# Patient Record
Sex: Female | Born: 1965
Health system: Southern US, Community
[De-identification: ages and names within clinical notes are randomized; demographics above are authoritative.]

## PROBLEM LIST (undated history)

## (undated) DIAGNOSIS — K589 Irritable bowel syndrome without diarrhea: Secondary | ICD-10-CM

## (undated) DIAGNOSIS — O24419 Gestational diabetes mellitus in pregnancy, unspecified control: Secondary | ICD-10-CM

## (undated) DIAGNOSIS — N920 Excessive and frequent menstruation with regular cycle: Secondary | ICD-10-CM

## (undated) DIAGNOSIS — E663 Overweight: Secondary | ICD-10-CM

## (undated) DIAGNOSIS — K635 Polyp of colon: Secondary | ICD-10-CM

## (undated) HISTORY — PX: CHOLECYSTECTOMY: SHX55

## (undated) HISTORY — DX: Overweight: E66.3

## (undated) HISTORY — DX: Polyp of colon: K63.5

## (undated) HISTORY — PX: ENDOMETRIAL ABLATION: SHX621

## (undated) HISTORY — DX: Irritable bowel syndrome, unspecified: K58.9

## (undated) HISTORY — PX: FINGER SURGERY: SHX640

## (undated) HISTORY — DX: Gestational diabetes mellitus in pregnancy, unspecified control: O24.419

## (undated) HISTORY — DX: Excessive and frequent menstruation with regular cycle: N92.0

## (undated) HISTORY — PX: TUBAL LIGATION: SHX77

---

## 1999-04-04 ENCOUNTER — Other Ambulatory Visit: Admission: RE | Admit: 1999-04-04 | Discharge: 1999-04-04 | Payer: Self-pay | Admitting: Gynecology

## 2001-03-07 ENCOUNTER — Encounter: Payer: Self-pay | Admitting: Emergency Medicine

## 2001-03-07 ENCOUNTER — Emergency Department (HOSPITAL_COMMUNITY): Admission: EM | Admit: 2001-03-07 | Discharge: 2001-03-07 | Payer: Self-pay | Admitting: Emergency Medicine

## 2001-03-29 ENCOUNTER — Emergency Department (HOSPITAL_COMMUNITY): Admission: EM | Admit: 2001-03-29 | Discharge: 2001-03-30 | Payer: Self-pay | Admitting: Emergency Medicine

## 2002-10-29 ENCOUNTER — Other Ambulatory Visit: Admission: RE | Admit: 2002-10-29 | Discharge: 2002-10-29 | Payer: Self-pay | Admitting: Gynecology

## 2004-04-18 ENCOUNTER — Other Ambulatory Visit: Admission: RE | Admit: 2004-04-18 | Discharge: 2004-04-18 | Payer: Self-pay | Admitting: Gynecology

## 2005-04-05 ENCOUNTER — Ambulatory Visit: Payer: Self-pay | Admitting: Cardiology

## 2005-05-03 ENCOUNTER — Other Ambulatory Visit: Admission: RE | Admit: 2005-05-03 | Discharge: 2005-05-03 | Payer: Self-pay | Admitting: Gynecology

## 2007-01-17 ENCOUNTER — Other Ambulatory Visit: Admission: RE | Admit: 2007-01-17 | Discharge: 2007-01-17 | Payer: Self-pay | Admitting: Gynecology

## 2007-03-26 ENCOUNTER — Encounter: Admission: RE | Admit: 2007-03-26 | Discharge: 2007-03-26 | Payer: Self-pay | Admitting: Gynecology

## 2008-01-22 ENCOUNTER — Ambulatory Visit: Payer: Self-pay | Admitting: Gynecology

## 2008-01-22 ENCOUNTER — Encounter: Payer: Self-pay | Admitting: Gynecology

## 2008-01-22 ENCOUNTER — Other Ambulatory Visit: Admission: RE | Admit: 2008-01-22 | Discharge: 2008-01-22 | Payer: Self-pay | Admitting: Gynecology

## 2008-11-26 ENCOUNTER — Ambulatory Visit: Payer: Self-pay | Admitting: Gynecology

## 2008-12-06 ENCOUNTER — Ambulatory Visit: Payer: Self-pay | Admitting: Gynecology

## 2009-03-17 ENCOUNTER — Ambulatory Visit: Payer: Self-pay | Admitting: Gynecology

## 2009-03-17 ENCOUNTER — Other Ambulatory Visit: Admission: RE | Admit: 2009-03-17 | Discharge: 2009-03-17 | Payer: Self-pay | Admitting: Gynecology

## 2010-02-27 ENCOUNTER — Ambulatory Visit: Admit: 2010-02-27 | Payer: Self-pay | Admitting: Gynecology

## 2010-03-20 ENCOUNTER — Other Ambulatory Visit: Payer: Self-pay | Admitting: Gynecology

## 2010-03-20 ENCOUNTER — Encounter: Payer: BC Managed Care – PPO | Admitting: Gynecology

## 2010-03-20 DIAGNOSIS — Z01419 Encounter for gynecological examination (general) (routine) without abnormal findings: Secondary | ICD-10-CM

## 2010-03-20 DIAGNOSIS — Z124 Encounter for screening for malignant neoplasm of cervix: Secondary | ICD-10-CM | POA: Insufficient documentation

## 2010-03-20 DIAGNOSIS — Z13220 Encounter for screening for lipoid disorders: Secondary | ICD-10-CM

## 2010-03-20 DIAGNOSIS — R635 Abnormal weight gain: Secondary | ICD-10-CM

## 2010-04-01 ENCOUNTER — Other Ambulatory Visit (HOSPITAL_COMMUNITY)
Admission: RE | Admit: 2010-04-01 | Discharge: 2010-04-01 | Disposition: A | Discharge status: Home / Self Care | Payer: BC Managed Care – PPO | Source: Ambulatory Visit | Attending: Gynecology | Admitting: Gynecology

## 2010-05-25 ENCOUNTER — Other Ambulatory Visit: Payer: BC Managed Care – PPO

## 2010-07-04 ENCOUNTER — Ambulatory Visit (INDEPENDENT_AMBULATORY_CARE_PROVIDER_SITE_OTHER): Payer: BC Managed Care – PPO | Admitting: Gynecology

## 2010-07-04 ENCOUNTER — Other Ambulatory Visit: Payer: Self-pay | Admitting: Gynecology

## 2010-07-04 DIAGNOSIS — N949 Unspecified condition associated with female genital organs and menstrual cycle: Secondary | ICD-10-CM

## 2010-07-04 DIAGNOSIS — R635 Abnormal weight gain: Secondary | ICD-10-CM

## 2010-08-01 ENCOUNTER — Other Ambulatory Visit: Payer: BC Managed Care – PPO | Admitting: Gynecology

## 2010-08-01 ENCOUNTER — Other Ambulatory Visit: Payer: BC Managed Care – PPO

## 2011-02-12 ENCOUNTER — Encounter: Payer: Self-pay | Admitting: Women's Health

## 2011-02-12 ENCOUNTER — Ambulatory Visit (INDEPENDENT_AMBULATORY_CARE_PROVIDER_SITE_OTHER): Payer: BC Managed Care – PPO | Admitting: Women's Health

## 2011-02-12 DIAGNOSIS — N920 Excessive and frequent menstruation with regular cycle: Secondary | ICD-10-CM

## 2011-02-12 MED ORDER — MEGESTROL ACETATE 20 MG PO TABS: 20.0000 mg | ORAL_TABLET | Freq: Two times a day (BID) | ORAL | Status: AC

## 2011-02-12 NOTE — Patient Instructions (Signed)
Take megace twice daily until bleeding stops and atleast 10  sonohysterogram with Dr Lily Peer  After bleeding stops  HER option ablation

## 2011-02-12 NOTE — Progress Notes (Signed)
Patient ID: Kristy Salas, female   DOB: 1965/07/01, 45 y.o.   MRN: [id number] Presents with a complaint of a heavy period. States cycles are always heavy and atleast 7 days, but this cycle is extreme. Cycle started yesterday at normal time, used lyseteda without relief. Today started bleeding through both tampon and pad every hour with clots.  BTL with a monthly cycle. Normal CBC,TSH, prolactin and negative endometrial biopsy in [facility],0865.  Exam: Abdomen soft, obese nontender. Speculum exam moderate amount of menses type blood noted, bimanual no CMT or adnexal fullness or tenderness.  Plan: Megace 20 mg by mouth twice a day for atleast 10 days or until bleeding stops. Schedule sonohysterogram with Dr. Lily Peer. Her option information was given reviewed.

## 2011-03-01 ENCOUNTER — Other Ambulatory Visit: Payer: BC Managed Care – PPO

## 2011-03-01 ENCOUNTER — Ambulatory Visit: Payer: BC Managed Care – PPO | Admitting: Gynecology

## 2011-03-05 ENCOUNTER — Other Ambulatory Visit: Payer: Self-pay | Admitting: Gynecology

## 2011-03-05 DIAGNOSIS — N920 Excessive and frequent menstruation with regular cycle: Secondary | ICD-10-CM

## 2011-03-06 ENCOUNTER — Ambulatory Visit (INDEPENDENT_AMBULATORY_CARE_PROVIDER_SITE_OTHER): Payer: BC Managed Care – PPO

## 2011-03-06 ENCOUNTER — Ambulatory Visit (INDEPENDENT_AMBULATORY_CARE_PROVIDER_SITE_OTHER): Payer: BC Managed Care – PPO | Admitting: Gynecology

## 2011-03-06 DIAGNOSIS — N920 Excessive and frequent menstruation with regular cycle: Secondary | ICD-10-CM | POA: Insufficient documentation

## 2011-03-06 DIAGNOSIS — D251 Intramural leiomyoma of uterus: Secondary | ICD-10-CM

## 2011-03-06 DIAGNOSIS — D259 Leiomyoma of uterus, unspecified: Secondary | ICD-10-CM

## 2011-03-06 DIAGNOSIS — N83209 Unspecified ovarian cyst, unspecified side: Secondary | ICD-10-CM

## 2011-03-06 DIAGNOSIS — N8300 Follicular cyst of ovary, unspecified side: Secondary | ICD-10-CM

## 2011-03-06 NOTE — Progress Notes (Signed)
Patient 46 year old gravida 3 para 3 with prior history tubal ligation presented to the office today for further evaluation of her ongoing menorrhagia she states her periods last 10-14 days. She had been placed on Lysteda and it brought down her cycles the only to 7 days although still heavy. She had a normal CBC TSH and prolactin and negative endometrial biopsy in May 2012 and she presented today for further evaluation.  Ultrasound today demonstrated uterus to measure 11.2 x 7.5 x 5.5 cm she is currently on her third week of her cycle her endometrium is 14.0. The uterus demonstrated a small intramural myomas measuring 17 x 16 mm, and 12 x 20 mm. Right ovary was normal left ovary had 2 thinwall echo-free follicles 20 x 25 mm, and 15 x 20 mm. Negative color flow. Sonohysterogram was negative.  Patient underwent an endometrial biopsy after cervix had been cleansed with Betadine solution and the uterus sounded to approximately 7 cm and a Pipelle was introduced into the intrauterine cavity to obtain tissue for histological evaluation.  We discussed with the patient the following options: #1 endometrial ablation her option technique in the office we'll check her insurance coverage. #2 Mirena IUD #3 last option would be for a hysterectomy  Patient was not bleeding today. Literature information all the above was provided. She will stop by the lab for her CBC today. Will check insurance company and plan a course of management and her desires by the end of the week.

## 2011-03-07 ENCOUNTER — Other Ambulatory Visit: Payer: Self-pay

## 2011-03-07 ENCOUNTER — Telehealth: Payer: Self-pay

## 2011-03-07 DIAGNOSIS — D72829 Elevated white blood cell count, unspecified: Secondary | ICD-10-CM

## 2011-03-07 DIAGNOSIS — N920 Excessive and frequent menstruation with regular cycle: Secondary | ICD-10-CM

## 2011-03-07 LAB — CBC WITH DIFFERENTIAL/PLATELET
Basophils Absolute: 0 10*3/uL (ref 0.0–0.1)
Basophils Relative: 0 % (ref 0–1)
Eosinophils Absolute: 0.2 10*3/uL (ref 0.0–0.7)
Eosinophils Relative: 2 % (ref 0–5)
HCT: 40 % (ref 36.0–46.0)
Lymphocytes Relative: 23 % (ref 12–46)
MCH: 31.6 pg (ref 26.0–34.0)
MCHC: 33 g/dL (ref 30.0–36.0)
MCV: 95.7 fL (ref 78.0–100.0)
Monocytes Absolute: 0.6 10*3/uL (ref 0.1–1.0)
Platelets: 282 10*3/uL (ref 150–400)
RDW: 13 % (ref 11.5–15.5)

## 2011-03-07 NOTE — Telephone Encounter (Signed)
I spoke with patient and let her know that both IUD and ablation in office are billable codes and she will have a $30 copayment and ins will pay 100% after that.  She wants to have Her Option Ablation done.  Dr. Glenetta Hew rec mid to third week of Feb as he anticipates her menses end of Jan. Patient said she is not sure when period will start. She will call me when it does and I will call Prometrium in for her to start Day 3 of menses.  She has a preexisting waiver attached to her ins plan and it ends 03/30/11.  I recommended to her that she wait to schedule this surgery until after that date as it is very expensive and could cause the ins co to look back to six months before her hire date to see if she was ever seen or treated for menorrhagia since then until 03/30/11.  If so, they could deny payment for procedure.  Patient is going to be out of town anyway from Feb 17-21.  The first available would be Feb 27th and I am concerned that is too close to when her period may start again. Patient wants me to schedule it here and she will reschedule if her period interferes.  I went ahead and scheduled it and instructions given regarding someone to drive her to and from procedure and needing a full bladder morning of procedure.  Instructions for full bladder along with her dates were mailed to her.  She will call me Day One of Menses and I will call Prometrium in.  Dr. Glenetta Hew- I hope this is acceptable to you. Thanks. KA

## 2011-03-08 NOTE — Telephone Encounter (Signed)
I spoke with Dr. Glenetta Hew regarding this and he did think all of this was fine.

## 2011-03-13 ENCOUNTER — Other Ambulatory Visit: Payer: BC Managed Care – PPO

## 2011-03-13 ENCOUNTER — Telehealth: Payer: Self-pay | Admitting: *Deleted

## 2011-03-13 NOTE — Telephone Encounter (Signed)
Pt was to come back in for cbc recheck due to abnormal WBC. Pt is feeling sick now and will reschedule appointment for lab recheck.

## 2011-03-21 ENCOUNTER — Telehealth: Payer: Self-pay

## 2011-03-21 MED ORDER — PROGESTERONE MICRONIZED 200 MG PO CAPS: ORAL_CAPSULE | ORAL | Status: DC

## 2011-03-21 NOTE — Telephone Encounter (Signed)
Patient called. Her menses began yesterday.  She will start Prometrium tomorrow.  Pt instructed to take hs beginning tomorrow until procedure.

## 2011-03-22 ENCOUNTER — Encounter: Payer: BC Managed Care – PPO | Admitting: Gynecology

## 2011-04-10 ENCOUNTER — Ambulatory Visit (INDEPENDENT_AMBULATORY_CARE_PROVIDER_SITE_OTHER): Payer: BC Managed Care – PPO | Admitting: Gynecology

## 2011-04-10 ENCOUNTER — Encounter: Payer: Self-pay | Admitting: Gynecology

## 2011-04-10 VITALS — BP 128/86

## 2011-04-10 DIAGNOSIS — N920 Excessive and frequent menstruation with regular cycle: Secondary | ICD-10-CM

## 2011-04-10 DIAGNOSIS — Z01818 Encounter for other preprocedural examination: Secondary | ICD-10-CM

## 2011-04-10 MED ORDER — DIAZEPAM 5 MG PO TABS: 5.0000 mg | ORAL_TABLET | Freq: Four times a day (QID) | ORAL | Status: AC | PRN

## 2011-04-10 MED ORDER — MISOPROSTOL 200 MCG PO TABS: 200.0000 ug | ORAL_TABLET | Freq: Once | ORAL | Status: DC

## 2011-04-10 MED ORDER — AZITHROMYCIN 500 MG PO TABS: 500.0000 mg | ORAL_TABLET | Freq: Every day | ORAL | Status: AC

## 2011-04-10 MED ORDER — HYDROCODONE-ACETAMINOPHEN 5-500 MG PO TABS: 2.0000 | ORAL_TABLET | Freq: Four times a day (QID) | ORAL | Status: AC | PRN

## 2011-04-10 NOTE — Patient Instructions (Signed)
Please follow printed instructions

## 2011-04-10 NOTE — Progress Notes (Signed)
Patient presented to the office today for preoperative examination prior to office endometrial ablation scheduled for tomorrow as a result of patient's menorrhagia. Patient with prior history of tubal ligation. Her preoperative evaluation has included a sonohysterogram recently with no intracavitary defects noted. Her endometrial biopsy demonstrated the following:  POLYPOID LATE PROLIFERATIVE/EARLY SECRETORY PHASE ENDOMETRIUM WITH BREAKDOWN. - NEGATIVE FOR HYPERPLASIA OR MALIGNANCY. - FRAGMENTS OF BENIGN ENDOCERVICAL MUCOSA  Patient has been kept on Prometrium 200 mg each bedtime for the past 2 weeks. Literature information had previously been provided on the office endometrial ablation (HerOption)  Exam: Abdomen soft nontender no rebound or guarding Pelvic: Bartholin urethra Skene was within normal limits Vagina: No gross lesions on inspection Cervix: No lesions or discharge Uterus: 10 weeks size Adnexa: No palpable masses or tenderness Rectal: Not examined  We discussed the procedure in detail. Her cervix was cleansed with Betadine solution a single-tooth tenaculum was then placed on the anterior cervical lip. The uterus sounded to 7-8 cm. A laminaria medium-size was placed intracervically. The single-tooth tenaculum was removed a gauze was left in the vagina to keep the laminaria in place.  Patient was given the following prescriptions: Zithromax 500 mg for her to take tomorrow morning before procedure and to repeat in 24 hours. Cytotec 200 mcg to take 1 tablet by mouth tonight. Valium 5 mg to take 1 by mouth 3 times a day when necessary anxiety. Vicodin 1 tablet by mouth every 4-6 hours when necessary cramping tonight or tomorrow to procedure. Consent form was signed instructions provided all questions were answered we'll follow accordingly.

## 2011-04-11 ENCOUNTER — Ambulatory Visit (INDEPENDENT_AMBULATORY_CARE_PROVIDER_SITE_OTHER): Payer: BC Managed Care – PPO

## 2011-04-11 ENCOUNTER — Encounter: Payer: Self-pay | Admitting: Gynecology

## 2011-04-11 ENCOUNTER — Ambulatory Visit (INDEPENDENT_AMBULATORY_CARE_PROVIDER_SITE_OTHER): Payer: BC Managed Care – PPO | Admitting: Gynecology

## 2011-04-11 DIAGNOSIS — N920 Excessive and frequent menstruation with regular cycle: Secondary | ICD-10-CM

## 2011-04-11 DIAGNOSIS — N882 Stricture and stenosis of cervix uteri: Secondary | ICD-10-CM

## 2011-04-11 DIAGNOSIS — N949 Unspecified condition associated with female genital organs and menstrual cycle: Secondary | ICD-10-CM

## 2011-04-11 MED ORDER — KETOROLAC TROMETHAMINE 30 MG/ML IJ SOLN
60.0000 mg | Freq: Once | INTRAMUSCULAR | Status: AC
  Administered 2011-04-11: 60 mg via INTRAMUSCULAR

## 2011-04-11 MED ORDER — LIDOCAINE HCL 1 % IJ SOLN: 10.0000 mL | Freq: Once | INTRAMUSCULAR | Status: DC

## 2011-04-11 NOTE — Progress Notes (Signed)
Patient Name:Kristy Salas  Patient MRN: [id number]   Date:04/11/2011   Diagnosis:  Excessive Uterine Bleeding/Menorrhagia  Procedure:  Endometrial cryoablation with intraoperative ultrasonic guidance  Procedure Medications Toradol 60 mg IM. Paracervical block with 1% lidocaine total 10 cc  Procedure:  The Patient was brought to the treatment room having previously been counseled for the procedure position and a speculum was inserted.  The cervix and upper vagina were cleaned with Betadine.  A single tooth tenaculum was placed on the anterior lip of the cervix.  A paracervical block was placed per above.  The uterus was not sounded to 7 cm.  Cervical dilation was performed.  Under ultrasound guidance, the Her Option probe was introduced into the uterine cavity after the pre procedural sequence was performed.  After assuring proper cornual placement, Cryoablation was then performed under continuous ultrasound guidance monitoring the growth of the cryozone.  Sequential cryoablation were performed in the following order, locations, freeze times and post freeze myometrial depths.           Location of Freeze Length of Time Myometrial Depth 1. right    6 Minutes  7.4 mm 2. left    6 Minutes  7.4 mm      Upon completion of the procedure, the instruments were removed, hemostasis visualized and the patient was assisted to the bathroom and then another exam room where she was observed.  Vitals:   Pre treatment:  Time: 10 AM  BP: 110/78  P: 68 Post Treatment:  Time: 12:22 PM  BP: 03/07/1978  P: 60  The patient tolerated the procedure well and was released in stable condition with her driver along with a copy of the post procedure instruments which were reviewed with her.  She is to return to the office in 2 weeks for a post procedural check.  [facility] HMD4:26 PMTD@

## 2011-04-12 ENCOUNTER — Telehealth: Payer: Self-pay | Admitting: *Deleted

## 2011-04-12 NOTE — Telephone Encounter (Signed)
Patient called with questions about post her option.  Bleed, nausea, answered questions.

## 2011-04-25 ENCOUNTER — Ambulatory Visit (INDEPENDENT_AMBULATORY_CARE_PROVIDER_SITE_OTHER): Payer: BC Managed Care – PPO | Admitting: Gynecology

## 2011-04-25 ENCOUNTER — Encounter: Payer: Self-pay | Admitting: Gynecology

## 2011-04-25 VITALS — BP 128/86

## 2011-04-25 DIAGNOSIS — Z9889 Other specified postprocedural states: Secondary | ICD-10-CM

## 2011-04-25 DIAGNOSIS — K602 Anal fissure, unspecified: Secondary | ICD-10-CM

## 2011-04-25 DIAGNOSIS — D72829 Elevated white blood cell count, unspecified: Secondary | ICD-10-CM

## 2011-04-25 MED ORDER — METRONIDAZOLE 500 MG PO TABS: ORAL_TABLET | ORAL | Status: DC

## 2011-04-25 MED ORDER — HYDROCORTISONE VALERATE 0.2 % EX CREA: TOPICAL_CREAM | Freq: Two times a day (BID) | CUTANEOUS | Status: AC

## 2011-04-25 NOTE — Patient Instructions (Signed)
Anal Fissure, Adult An anal fissure is a small tear or crack in the skin around the anus. Bleeding from a fissure usually stops on its own within a few minutes. However, bleeding will often reoccur with each bowel movement until the crack heals.  CAUSES   Passing large, hard stools.   Frequent diarrheal stools.   Constipation.   Inflammatory bowel disease (Crohn's disease or ulcerative colitis).   Infections.   Anal sex.  SYMPTOMS   Small amounts of blood seen on your stools, on toilet paper, or in the toilet after a bowel movement.   Rectal bleeding.   Painful bowel movements.   Itching or irritation around the anus.  DIAGNOSIS Your caregiver will examine the anal area. An anal fissure can usually be seen with careful inspection. A rectal exam may be performed and a short tube (anoscope) may be used to examine the anal canal. TREATMENT   You may be instructed to take fiber supplements. These supplements can soften your stool to help make bowel movements easier.   Sitz baths may be recommended to help heal the tear. Do not use soap in the sitz baths.   A medicated cream or ointment may be prescribed to lessen discomfort.  HOME CARE INSTRUCTIONS   Maintain a diet high in fruits, whole grains, and vegetables. Avoid constipating foods like bananas and dairy products.   Take sitz baths as directed by your caregiver.   Drink enough fluids to keep your urine clear or pale yellow.   Only take over-the-counter or prescription medicines for pain, discomfort, or fever as directed by your caregiver. Do not take aspirin as this may increase bleeding.   Do not use ointments containing numbing medications (anesthetics) or hydrocortisone. They could slow healing.  SEEK MEDICAL CARE IF:   Your fissure is not completely healed within 3 days.   You have further bleeding.   You have a fever.   You have diarrhea mixed with blood.   You have pain.   Your problem is getting worse  rather than better.  MAKE SURE YOU:   Understand these instructions.   Will watch your condition.   Will get help right away if you are not doing well or get worse.  Document Released: 01/29/2005 Document Revised: 01/18/2011 Document Reviewed: 07/16/2010 Exit[employer] Patient Information 2012 [employer]. 

## 2011-04-25 NOTE — Progress Notes (Signed)
Patient presented to the office today for her postop visit she is status post her option endometrial ablation for menorrhagia 2 weeks ago. She is complaining of slight watery discharge. She's also complaining of an area of irritation near her rectum. Patient had a CBC on January 22 that demonstrated her white blood count was 12,000. On further questioning it appears she was getting over a URI near the time that it was drawn. Nevertheless will check a CBC today.  Exam: Abdomen soft nontender no rebound or guarding Pelvic: Bartholin urethra Skene glands within normal limits Vagina: Slight watery discharge Cervix: No lesion discharge Uterus: Anteverted normal size shape and consistency Rectal: Not examined  Approximately 3-4 finger breast posterior to her rectum there appears to be small fissure but no drainage. She will be placed on sitz baths daily for the next 7-10 days. She was also p prescribed Westcort cream 0.2% to apply 2-3 times a day for the next 7-10 days. For watery discharge she was given Flagyl 500 mg to take 1 by mouth twice a day for 5 days. She's otherwise scheduled to return back in one year or when necessary.

## 2011-04-26 LAB — CBC WITH DIFFERENTIAL/PLATELET
Basophils Relative: 1 % (ref 0–1)
HCT: 39.9 % (ref 36.0–46.0)
Hemoglobin: 13.5 g/dL (ref 12.0–15.0)
Lymphs Abs: 2.9 10*3/uL (ref 0.7–4.0)
MCH: 31.6 pg (ref 26.0–34.0)
MCHC: 33.8 g/dL (ref 30.0–36.0)
Monocytes Absolute: 0.7 10*3/uL (ref 0.1–1.0)
Monocytes Relative: 9 % (ref 3–12)
Neutro Abs: 4.2 10*3/uL (ref 1.7–7.7)
RBC: 4.27 MIL/uL (ref 3.87–5.11)

## 2011-06-15 ENCOUNTER — Encounter: Payer: Self-pay | Admitting: Gynecology

## 2012-05-01 ENCOUNTER — Ambulatory Visit (INDEPENDENT_AMBULATORY_CARE_PROVIDER_SITE_OTHER): Payer: Self-pay | Admitting: Gynecology

## 2012-05-01 ENCOUNTER — Encounter: Payer: Self-pay | Admitting: Gynecology

## 2012-05-01 VITALS — BP 128/74 | Ht 62.75 in | Wt 228.0 lb

## 2012-05-01 DIAGNOSIS — R635 Abnormal weight gain: Secondary | ICD-10-CM | POA: Insufficient documentation

## 2012-05-01 DIAGNOSIS — R413 Other amnesia: Secondary | ICD-10-CM | POA: Insufficient documentation

## 2012-05-01 DIAGNOSIS — Z8632 Personal history of gestational diabetes: Secondary | ICD-10-CM

## 2012-05-01 LAB — CBC WITH DIFFERENTIAL/PLATELET
Basophils Relative: 1 % (ref 0–1)
Eosinophils Relative: 2 % (ref 0–5)
HCT: 40 % (ref 36.0–46.0)
Hemoglobin: 13.7 g/dL (ref 12.0–15.0)
MCH: 31.6 pg (ref 26.0–34.0)
MCHC: 34.3 g/dL (ref 30.0–36.0)
MCV: 92.2 fL (ref 78.0–100.0)
Monocytes Absolute: 0.5 10*3/uL (ref 0.1–1.0)
Monocytes Relative: 7 % (ref 3–12)
Neutro Abs: 4 10*3/uL (ref 1.7–7.7)
RDW: 13.3 % (ref 11.5–15.5)

## 2012-05-01 MED ORDER — TRIAMCINOLONE 0.1 % CREAM:EUCERIN CREAM 1:1: 1.0000 "application " | TOPICAL_CREAM | Freq: Three times a day (TID) | CUTANEOUS | Status: DC

## 2012-05-01 NOTE — Patient Instructions (Addendum)

## 2012-05-01 NOTE — Progress Notes (Signed)
Kristy Salas 13-Feb-1965 [id number]   History:    47 y.o.  for annual gyn exam who is concerned that she was having minimal menses since she had her endometrial ablation that was done in 2013. She did bring up to my attention that over the past several years she has noted that she has difficulty staying  on task whereby she is doing many things at the same time before completing one project and moving to the next. She also feels that her short-term memory is decreasing as well as trying to stay focused. She stated that she never went beyond high school and had difficulty in learning. 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  Patient would know prior history of abnormal Pap smear. Patient has not received a Tdap vaccine. Patient with prior tubal sterilization procedure.  Patient's brother had history of colon cancer as well as her on call. Patient had a colonoscopy 2010 by Dr. Elnoria Howard who removed benign polyps. Patient's mother had cirrhosis of the liver (hepatitis C).  Patient has  a sister who had breast cancer at the age of 66 and was to check with her sister to see if she had the BRCA one BRCA2 testing.  Patient with past history of gestational diabetes   Past medical history,surgical history, family history and social history were all reviewed and documented in the EPIC chart.  Gynecologic History Patient's last menstrual period was 04/10/2012. Contraception: tubal ligation Last Pap: 2012. Results were: normal Last mammogram: April 2013. Results were: normal  Obstetric History OB History   Grav Para Term Preterm Abortions TAB SAB Ect Mult Living   3 3        3      # Outc Date GA Lbr Len/2nd Wgt Sex Del Anes PTL Lv   1 PAR            2 PAR            3 PAR                ROS: A ROS was performed and pertinent positives and negatives are included in the history.  GENERAL: No fevers or chills. HEENT: No change in vision, no earache, sore throat or sinus  congestion. NECK: No pain or stiffness. CARDIOVASCULAR: No chest pain or pressure. No palpitations. PULMONARY: No shortness of breath, cough or wheeze. GASTROINTESTINAL: No abdominal pain, nausea, vomiting or diarrhea, melena or bright red blood per rectum. GENITOURINARY: No urinary frequency, urgency, hesitancy or dysuria. MUSCULOSKELETAL: No joint or muscle pain, no back pain, no recent trauma. DERMATOLOGIC: No rash, no itching, no lesions. ENDOCRINE: No polyuria, polydipsia, no heat or cold intolerance. No recent change in weight. HEMATOLOGICAL: No anemia or easy bruising or bleeding. NEUROLOGIC: No headache, seizures, numbness, tingling or weakness. PSYCHIATRIC: No depression, no loss of interest in normal activity or change in sleep pattern.     Exam: chaperone present  BP 128/74  Ht 5' 2.75" (1.594 m)  Wt 228 lb (103.42 kg)  BMI 40.7 kg/m2  LMP 04/10/2012  Body mass index is 40.7 kg/(m^2).  General appearance : Well developed well nourished female. No acute distress HEENT: Neck supple, trachea midline, no carotid bruits, no thyroidmegaly Lungs: Clear to auscultation, no rhonchi or wheezes, or rib retractions  Heart: Regular rate and rhythm, no murmurs or gallops Breast:Examined in sitting and supine position were symmetrical in appearance, no palpable masses or tenderness,  no skin retraction, no nipple  inversion, no nipple discharge, no skin discoloration, no axillary or supraclavicular lymphadenopathy Abdomen: no palpable masses or tenderness, no rebound or guarding Extremities: no edema or skin discoloration or tenderness  Pelvic:  Bartholin, Urethra, Skene Glands: Within normal limits             Vagina: No gross lesions or discharge  Cervix: No gross lesions or discharge  Uterus  anteverted, normal size, shape and consistency, non-tender and mobile  Adnexa  Without masses or tenderness  Anus and perineum  normal   Rectovaginal  normal sphincter tone without palpated masses or  tenderness             Hemoccult: Not done.     Assessment/Plan:  47 y.o. female for annual exam who was reassured that but not having any menses are very light after an endometrial ablation is reassuring. She is going to be referred to the neurologist at [facility] for further evaluation of her memory loss and the possibility she may have ADD. No Pap smear done today the new guidelines were discussed. She was reminded to do her monthly self breast examination and her mammogram is due in April. She will be mailed the Hemoccult cards for her to submit to the office for testing and next year she will need a colonoscopy cystoscopy will be 5 years since her last colonoscopy were benign polyps were detected.the following labs were ordered: CBC, screening cholesterol, hemoglobin A1c, TSH and urinalysis. The Tdap vaccine was provided today.    Ok Edwards MD, 5:41 PM 05/01/2012

## 2012-05-01 NOTE — Progress Notes (Deleted)
Spotting only

## 2012-05-02 ENCOUNTER — Other Ambulatory Visit: Payer: Self-pay | Admitting: *Deleted

## 2012-05-02 ENCOUNTER — Telehealth: Payer: Self-pay | Admitting: *Deleted

## 2012-05-02 DIAGNOSIS — E7800 Pure hypercholesterolemia, unspecified: Secondary | ICD-10-CM

## 2012-05-02 LAB — URINALYSIS W MICROSCOPIC + REFLEX CULTURE
Casts: NONE SEEN
Crystals: NONE SEEN
Glucose, UA: NEGATIVE mg/dL
Ketones, ur: NEGATIVE mg/dL
Leukocytes, UA: NEGATIVE
Nitrite: NEGATIVE
Specific Gravity, Urine: 1.021 (ref 1.005–1.030)
pH: 5.5 (ref 5.0–8.0)

## 2012-05-02 LAB — TSH: TSH: 2.201 u[IU]/mL (ref 0.350–4.500)

## 2012-05-02 LAB — HEMOGLOBIN A1C: Mean Plasma Glucose: 108 mg/dL (ref <117)

## 2012-05-02 MED ORDER — TRIAMCINOLONE ACETONIDE 0.1 % EX CREA: TOPICAL_CREAM | Freq: Two times a day (BID) | CUTANEOUS | Status: DC

## 2012-05-02 NOTE — Telephone Encounter (Signed)
Pharmacy called regarding the rx for TRIAMCINOLONE 0.1 % CREAM : EUCERIN CREAM sent yesterday. She is asking for the ratio and the quantity of medication. She stated there are different ratio's  & quantity depending on why patient is taking medication. I didn't see in office why pt is taking rx. Please advise

## 2012-05-02 NOTE — Telephone Encounter (Signed)
Triamcinolone 0.1% to apply twice a day for 5-7 days #1small tube

## 2012-05-02 NOTE — Telephone Encounter (Signed)
Correct rx sent.

## 2012-05-06 ENCOUNTER — Telehealth: Payer: Self-pay | Admitting: *Deleted

## 2012-05-06 NOTE — Telephone Encounter (Signed)
Message copied by Aura Camps on Tue May 06, 2012  8:58 AM ------      Message from: Ok Edwards      Created: Fri May 02, 2012 12:55 PM       I would recommend then that you make an [facility] for further discussion and possible testing. Tell her to make a list of all the relatives on both sides of the family who have had any form of cancer and to bring it with her to that appointment.      ----- Message -----         From: Ted Mcalpine         Sent: 05/02/2012  12:30 PM           To: Ok Edwards, MD            Pt said she wasn't sure if her 1 sister that had breast cancer had testing done. Pt said if you think this beast for her to have BRCA testing done, then she is willing to do this.      ----- Message -----         From: Ok Edwards, MD         Sent: 05/01/2012   5:53 PM           To: Betsey Amen, please contact this patient and tell her that she left before we gave her the Hemoccult cards for testing. With her family history of colon cancer and her having had polyps in 2010 she needs her colonoscopy next year. Also ask her if her sister ever was tested for the BRCA one BRCA2 gene mutation for breast cancer.             ------

## 2012-05-06 NOTE — Telephone Encounter (Signed)
Referral faxed to [facility] for BRCA1 & 2 testing.

## 2012-05-07 ENCOUNTER — Telehealth: Payer: Self-pay | Admitting: *Deleted

## 2012-05-07 DIAGNOSIS — R413 Other amnesia: Secondary | ICD-10-CM

## 2012-05-07 NOTE — Telephone Encounter (Signed)
We can refer her to one of the neurologist in [facility].

## 2012-05-07 NOTE — Telephone Encounter (Signed)
Pt was given a name to a physician at [facility] to be evaluation of her memory loss, pt called and tried to make appointment, but the doctor is no longer there. Would you like patient to be seen by physician in [facility]? Please advise

## 2012-05-08 ENCOUNTER — Other Ambulatory Visit: Payer: BC Managed Care – PPO

## 2012-05-08 DIAGNOSIS — E7800 Pure hypercholesterolemia, unspecified: Secondary | ICD-10-CM

## 2012-05-08 LAB — LIPID PANEL
Total CHOL/HDL Ratio: 4.5 Ratio
VLDL: 41 mg/dL — ABNORMAL HIGH (ref 0–40)

## 2012-05-08 NOTE — Telephone Encounter (Signed)
Referral placed for Dr. Arbutus Leas. I spoke with office and the physician will review records and schedule they contact us with time and date.

## 2012-05-09 ENCOUNTER — Other Ambulatory Visit: Payer: Self-pay | Admitting: *Deleted

## 2012-05-09 DIAGNOSIS — E7800 Pure hypercholesterolemia, unspecified: Secondary | ICD-10-CM

## 2012-05-12 ENCOUNTER — Telehealth: Payer: Self-pay | Admitting: Neurology

## 2012-05-12 NOTE — Telephone Encounter (Signed)
The patient is hoping to get a status on if she is able to schedule an appointment with Dr.Tat.  Can I go ahead and schedule this?

## 2012-05-12 NOTE — Telephone Encounter (Signed)
Called pt to schedule, left voice mail message on machine.

## 2012-05-12 NOTE — Telephone Encounter (Signed)
Nevermind - I have pt's approved referral and will be calling pt to schedule now.   Thanks!

## 2012-05-15 ENCOUNTER — Telehealth: Payer: Self-pay | Admitting: *Deleted

## 2012-05-15 NOTE — Telephone Encounter (Signed)
Left message for pt to return my call so I can schedule a genetic appt.  

## 2012-05-15 NOTE — Telephone Encounter (Signed)
App[facility].

## 2012-05-15 NOTE — Telephone Encounter (Signed)
Pt returned my call and I confirmed 06/02/12 genetic appt w/ pt.  Called referring to make them aware.

## 2012-06-02 ENCOUNTER — Other Ambulatory Visit: Payer: BC Managed Care – PPO | Admitting: Lab

## 2012-06-02 ENCOUNTER — Encounter: Payer: Self-pay | Admitting: Lab

## 2012-10-10 ENCOUNTER — Encounter: Payer: Self-pay | Admitting: Gynecology

## 2013-07-23 ENCOUNTER — Other Ambulatory Visit: Payer: Self-pay | Admitting: Gynecology

## 2013-07-23 ENCOUNTER — Telehealth: Payer: Self-pay

## 2013-07-23 DIAGNOSIS — N920 Excessive and frequent menstruation with regular cycle: Secondary | ICD-10-CM

## 2013-07-23 MED ORDER — MEGESTROL ACETATE 40 MG PO TABS: 40.0000 mg | ORAL_TABLET | Freq: Two times a day (BID) | ORAL | Status: DC

## 2013-07-23 NOTE — Telephone Encounter (Signed)
Has been having some heavy vaginal bleeding.  Unusual for her since history of endo ablation. She said she just got her period for the second time this month this morning and now it is extremely heavy/  She questions if she needs to do anything or come in  Before she leaves on vacation in the morning.

## 2013-07-23 NOTE — Telephone Encounter (Signed)
We can call in Megace 40mg  she can take BID for 2 weeks. I then would need to see her with for sonohysterogram. This mdicine will stop her bleeding so she can enjoy her vacation since she is leaving tomorrow.

## 2013-07-23 NOTE — Telephone Encounter (Signed)
Left detailed message on her cell phone voice mail.  Asked her to call back to schedule [facility] and order will be in system.  Rx sent to pharmacy and I explained to patient how to take it in voice mail. Told her to call if any questions.

## 2013-08-03 ENCOUNTER — Encounter: Payer: BC Managed Care – PPO | Admitting: Gynecology

## 2013-08-25 ENCOUNTER — Other Ambulatory Visit: Payer: Self-pay | Admitting: Gynecology

## 2013-08-25 DIAGNOSIS — N920 Excessive and frequent menstruation with regular cycle: Secondary | ICD-10-CM

## 2013-08-28 ENCOUNTER — Ambulatory Visit (INDEPENDENT_AMBULATORY_CARE_PROVIDER_SITE_OTHER): Payer: BC Managed Care – PPO | Admitting: Gynecology

## 2013-08-28 ENCOUNTER — Ambulatory Visit (INDEPENDENT_AMBULATORY_CARE_PROVIDER_SITE_OTHER): Payer: BC Managed Care – PPO

## 2013-08-28 DIAGNOSIS — N938 Other specified abnormal uterine and vaginal bleeding: Secondary | ICD-10-CM

## 2013-08-28 DIAGNOSIS — N949 Unspecified condition associated with female genital organs and menstrual cycle: Secondary | ICD-10-CM

## 2013-08-28 DIAGNOSIS — N925 Other specified irregular menstruation: Secondary | ICD-10-CM

## 2013-08-28 DIAGNOSIS — N920 Excessive and frequent menstruation with regular cycle: Secondary | ICD-10-CM

## 2013-08-28 LAB — CBC WITH DIFFERENTIAL/PLATELET
Basophils Absolute: 0.1 10*3/uL (ref 0.0–0.1)
Basophils Relative: 1 % (ref 0–1)
Eosinophils Absolute: 0.1 10*3/uL (ref 0.0–0.7)
Eosinophils Relative: 1 % (ref 0–5)
HCT: 40.2 % (ref 36.0–46.0)
Hemoglobin: 14 g/dL (ref 12.0–15.0)
LYMPHS ABS: 2.3 10*3/uL (ref 0.7–4.0)
Lymphocytes Relative: 36 % (ref 12–46)
MCH: 31.9 pg (ref 26.0–34.0)
MCHC: 34.8 g/dL (ref 30.0–36.0)
MCV: 91.6 fL (ref 78.0–100.0)
MONOS PCT: 7 % (ref 3–12)
Monocytes Absolute: 0.4 10*3/uL (ref 0.1–1.0)
NEUTROS ABS: 3.5 10*3/uL (ref 1.7–7.7)
NEUTROS PCT: 55 % (ref 43–77)
Platelets: 275 10*3/uL (ref 150–400)
RBC: 4.39 MIL/uL (ref 3.87–5.11)
RDW: 13.2 % (ref 11.5–15.5)
WBC: 6.4 10*3/uL (ref 4.0–10.5)

## 2013-08-28 LAB — TSH: TSH: 2.119 u[IU]/mL (ref 0.350–4.500)

## 2013-08-28 NOTE — Progress Notes (Signed)
   Patient presented to the office today for evaluation of her recent dysfunction uterine bleeding. On June 11 before vacation she had called the office stating that she had 2 cycles in one month and was heavy. Patient has history of endometrial ablation approximately 3 years ago and had done well. She was started on Megace 40 mg twice a day for 2 weeks and scheduled to return today which she has for sonohysterogram.  Ultrasound: Uterus measuring 9.5 x 6.7 x 5.0 cm with endometrial stripe of 6.3 mm. Patient with 3 small fibroids the largest one measuring 21 x 15 mm essentially unchanged from previous ultrasounds. Ovaries was normal.  Patient was counseled for for endometrial biopsy. The cervix was cleansed with Betadine solution. A single-tooth tenaculum was placed on the anterior cervical lip a sterile Pipelle was introduced through the intrauterine cavity which broke up some agglutination of the uterine wall which caused some discomfort but the sonohysterogram demonstrated no intracavitary defects. An endometrial biopsy was obtained and submitted for histological evaluation.  Assessment/plan: Isolated event of 2 cycles in one month recently after endometrial ablation several years ago. Patient with history of very small fibroids none of them encroaching into the uterine cavity. Normal sonohysterogram. Endometrial biopsy pathology report pending. We'll obtain TSH and prolactin along with a CBC. These lab tests are normal we'll continue to monitor over the next 3-6 months.

## 2013-08-29 LAB — PROLACTIN: PROLACTIN: 8.5 ng/mL

## 2013-09-01 ENCOUNTER — Encounter: Payer: BC Managed Care – PPO | Admitting: Gynecology

## 2013-09-10 ENCOUNTER — Other Ambulatory Visit (HOSPITAL_COMMUNITY)
Admission: RE | Admit: 2013-09-10 | Discharge: 2013-09-10 | Disposition: A | Discharge status: Home / Self Care | Payer: BC Managed Care – PPO | Source: Ambulatory Visit | Attending: Gynecology | Admitting: Gynecology

## 2013-09-10 ENCOUNTER — Encounter: Payer: Self-pay | Admitting: Gynecology

## 2013-09-10 ENCOUNTER — Ambulatory Visit (INDEPENDENT_AMBULATORY_CARE_PROVIDER_SITE_OTHER): Payer: BC Managed Care – PPO | Admitting: Gynecology

## 2013-09-10 VITALS — BP 126/78 | Ht 63.0 in | Wt 230.0 lb

## 2013-09-10 DIAGNOSIS — Z1151 Encounter for screening for human papillomavirus (HPV): Secondary | ICD-10-CM | POA: Insufficient documentation

## 2013-09-10 DIAGNOSIS — Z01419 Encounter for gynecological examination (general) (routine) without abnormal findings: Secondary | ICD-10-CM

## 2013-09-10 DIAGNOSIS — Z803 Family history of malignant neoplasm of breast: Secondary | ICD-10-CM

## 2013-09-10 DIAGNOSIS — Z800 Family history of malignant neoplasm of digestive organs: Secondary | ICD-10-CM

## 2013-09-10 NOTE — Patient Instructions (Signed)
Our office will call you with appointment to see geneticist.

## 2013-09-11 ENCOUNTER — Telehealth: Payer: Self-pay | Admitting: *Deleted

## 2013-09-11 NOTE — Progress Notes (Signed)
Kristy Salas 1965/02/15 [id number]   History:    48 y.o.  for annual gyn exam who was seen in the office on July 17 for evaluation of recent dysfunction uterine bleeding she had reported. She had reported that in June while on vacation she had 2 cycles a month and was reported to be heavy. Review of her records indicated that she had had endometrial ablation 3 years prior. She had done well until then. She had been started on Megace 40 mg twice a day for 2 weeks and to follow up with a sonohysterogram which was done on July 17. Her ultrasound demonstrated the following:  Uterus measuring 9.5 x 6.7 x 5.0 cm with endometrial stripe of 6.3 mm. Patient had 3 small fibroids the largest one measuring 21 x 15 mm essentially unchanged from previous ultrasounds. Her ovaries were reported to be normal. She had a normal sonohysterogram was no intracavitary defects and an endometrial biopsy had been performed which demonstrated the following:  Endometrium, biopsy - DEGENERATING SECRETORY-TYPE ENDOMETRIUM. - THERE IS NO EVIDENCE OF HYPERPLASIA OR MALIGNANCY. - SEE COMMENT. Microscopic Comment This pattern can be associated with menses, irregular shedding or breakthrough bleeding associated with hormonal therapy.  On the same visit patient had a TSH and prolactin CBC which all were normal. Who reports monitor cycles next 3-6 months of this persisted she would be a candidate for hysterectomy. She was asymptomatic today. She was told me that she has fallen family members with different types of cancer: Her sister had breast cancer in her 25s and has not been tested for BRCA1 and BRCA2 gene mutation. Another sister had kidney cancer and her brother had colon cancer in his 21s. Patient herself had a colonoscopy in 2010 by Dr. Benson Norway who had removed benign colon polyps. Her mother had cirrhosis of the liver (hepatitis C) Patient with no previous history of abnormal Pap smears.  Past medical history,surgical  history, family history and social history were all reviewed and documented in the EPIC chart.  Gynecologic History Patient's last menstrual period was 07/22/2013. Contraception: tubal ligation Last Pap: 2012. Results were: normal Last mammogram: 2014. Results were: normal  Obstetric History OB History  Gravida Para Term Preterm AB SAB TAB Ectopic Multiple Living  '3 3        3    ' # Outcome Date GA Lbr Len/2nd Weight Sex Delivery Anes PTL Lv  3 PAR           2 PAR           1 PAR                ROS: A ROS was performed and pertinent positives and negatives are included in the history.  GENERAL: No fevers or chills. HEENT: No change in vision, no earache, sore throat or sinus congestion. NECK: No pain or stiffness. CARDIOVASCULAR: No chest pain or pressure. No palpitations. PULMONARY: No shortness of breath, cough or wheeze. GASTROINTESTINAL: No abdominal pain, nausea, vomiting or diarrhea, melena or bright red blood per rectum. GENITOURINARY: No urinary frequency, urgency, hesitancy or dysuria. MUSCULOSKELETAL: No joint or muscle pain, no back pain, no recent trauma. DERMATOLOGIC: No rash, no itching, no lesions. ENDOCRINE: No polyuria, polydipsia, no heat or cold intolerance. No recent change in weight. HEMATOLOGICAL: No anemia or easy bruising or bleeding. NEUROLOGIC: No headache, seizures, numbness, tingling or weakness. PSYCHIATRIC: No depression, no loss of interest in normal activity or change in sleep pattern.  Exam: chaperone present  BP 126/78  Ht '5\' 3"'  (1.6 m)  Wt 230 lb (104.327 kg)  BMI 40.75 kg/m2  LMP 07/22/2013  Body mass index is 40.75 kg/(m^2).  General appearance : Well developed well nourished female. No acute distress HEENT: Neck supple, trachea midline, no carotid bruits, no thyroidmegaly Lungs: Clear to auscultation, no rhonchi or wheezes, or rib retractions  Heart: Regular rate and rhythm, no murmurs or gallops Breast:Examined in sitting and supine  position were symmetrical in appearance, no palpable masses or tenderness,  no skin retraction, no nipple inversion, no nipple discharge, no skin discoloration, no axillary or supraclavicular lymphadenopathy Abdomen: no palpable masses or tenderness, no rebound or guarding Extremities: no edema or skin discoloration or tenderness  Pelvic:  Bartholin, Urethra, Skene Glands: Within normal limits             Vagina: No gross lesions or discharge  Cervix: No gross lesions or discharge  Uterus  anteverted, normal size, shape and consistency, non-tender and mobile  Adnexa  Without masses or tenderness  Anus and perineum  normal   Rectovaginal  normal sphincter tone without palpated masses or tenderness             Hemoccult not indicated     Assessment/Plan:  48 y.o. female for annual exam who was due for a Pap smear this year and was done. The patient will return later next week in a fasting state for fasting lipid profile along with a comprehensive metabolic panel and urinalysis. She was reminded to contact her gastroenterologist for her five-year followup colonoscopy as a result a family history of colon cancer in herself having had colon polyps. We are also would to refer her to the [facility] in consultation with the geneticist because of strong family history of multiple cancers for further screening and testing. She was reminded to do her monthly breast exams. We'll continue to monitor her menstrual cycles for the next 3-4 months of irregular bleeding continues will then proceed then with discussing possible hysterectomy in the near future. She is also reminded that she is due for a mammogram this year as well.  Note: This dictation was prepared with  Dragon/digital dictation along withSmart phrase technology. Any transcriptional errors that result from this process are unintentional.   Terrance Mass MD, 7:51 AM 09/11/2013

## 2013-09-11 NOTE — Telephone Encounter (Signed)
Referral faxed to [facility], they will contact pt to schedule.

## 2013-09-11 NOTE — Telephone Encounter (Signed)
Message copied by Thamas Jaegers on Fri Sep 11, 2013  8:51 AM ------      Message from: Terrance Mass      Created: Thu Sep 10, 2013  4:02 PM       Please schedule appointment with geneticist at [facility] for counseling and testing. Patient with two first line relatives with breast and colon cancer. ------

## 2013-09-14 LAB — CYTOLOGY - PAP

## 2013-09-16 ENCOUNTER — Telehealth: Payer: Self-pay | Admitting: Genetic Counselor

## 2013-09-16 ENCOUNTER — Other Ambulatory Visit: Payer: BC Managed Care – PPO

## 2013-09-16 NOTE — Telephone Encounter (Signed)
LEFT MESSAGE FOR PATIENT TO RETURN CALL TO SCHEDULE GENETIC APPT.  °

## 2013-09-17 ENCOUNTER — Other Ambulatory Visit: Payer: BC Managed Care – PPO

## 2013-09-17 DIAGNOSIS — Z01419 Encounter for gynecological examination (general) (routine) without abnormal findings: Secondary | ICD-10-CM

## 2013-09-17 LAB — COMPREHENSIVE METABOLIC PANEL
ALT: 20 U/L (ref 0–35)
AST: 16 U/L (ref 0–37)
Albumin: 4.1 g/dL (ref 3.5–5.2)
Alkaline Phosphatase: 87 U/L (ref 39–117)
BUN: 15 mg/dL (ref 6–23)
CALCIUM: 9.5 mg/dL (ref 8.4–10.5)
CHLORIDE: 103 meq/L (ref 96–112)
CO2: 26 meq/L (ref 19–32)
Creat: 0.7 mg/dL (ref 0.50–1.10)
Glucose, Bld: 95 mg/dL (ref 70–99)
Potassium: 4.3 mEq/L (ref 3.5–5.3)
Sodium: 139 mEq/L (ref 135–145)
TOTAL PROTEIN: 7.1 g/dL (ref 6.0–8.3)
Total Bilirubin: 1 mg/dL (ref 0.2–1.2)

## 2013-09-17 LAB — LIPID PANEL
CHOLESTEROL: 187 mg/dL (ref 0–200)
HDL: 51 mg/dL (ref >39)
LDL Cholesterol: 97 mg/dL (ref 0–99)
Total CHOL/HDL Ratio: 3.7 Ratio
Triglycerides: 196 mg/dL — ABNORMAL HIGH (ref <150)
VLDL: 39 mg/dL (ref 0–40)

## 2013-09-18 LAB — URINALYSIS W MICROSCOPIC + REFLEX CULTURE
BILIRUBIN URINE: NEGATIVE
CRYSTALS: NONE SEEN
Casts: NONE SEEN
Glucose, UA: NEGATIVE mg/dL
Hgb urine dipstick: NEGATIVE
Ketones, ur: NEGATIVE mg/dL
LEUKOCYTES UA: NEGATIVE
Nitrite: NEGATIVE
Protein, ur: NEGATIVE mg/dL
SPECIFIC GRAVITY, URINE: 1.019 (ref 1.005–1.030)
Urobilinogen, UA: 0.2 mg/dL (ref 0.0–1.0)
pH: 7 (ref 5.0–8.0)

## 2013-09-22 NOTE — Telephone Encounter (Signed)
[facility] left message on 09/16/13 to call to schedule appointment.

## 2013-10-13 ENCOUNTER — Encounter: Payer: Self-pay | Admitting: Gynecology

## 2013-12-14 ENCOUNTER — Encounter: Payer: Self-pay | Admitting: Gynecology

## 2014-02-17 ENCOUNTER — Telehealth: Payer: Self-pay | Admitting: Genetic Counselor

## 2014-02-17 NOTE — Telephone Encounter (Signed)
Pt aware of genetic appt.  On 02/24/14

## 2014-02-22 ENCOUNTER — Telehealth: Payer: Self-pay | Admitting: Genetic Counselor

## 2014-02-22 NOTE — Telephone Encounter (Signed)
Spoke with Kristy Salas about OOP costs for genetic testing.  Explained about [facility] policy about calling after preauthorization if the OOP cost is over $100, and running the test if it is under $100.  She will keep appointment on Wed.

## 2014-02-24 ENCOUNTER — Ambulatory Visit (HOSPITAL_BASED_OUTPATIENT_CLINIC_OR_DEPARTMENT_OTHER): Payer: BLUE CROSS/BLUE SHIELD | Admitting: Genetic Counselor

## 2014-02-24 ENCOUNTER — Other Ambulatory Visit: Payer: BLUE CROSS/BLUE SHIELD

## 2014-02-24 ENCOUNTER — Encounter: Payer: Self-pay | Admitting: Genetic Counselor

## 2014-02-24 DIAGNOSIS — K635 Polyp of colon: Secondary | ICD-10-CM | POA: Insufficient documentation

## 2014-02-24 DIAGNOSIS — Z803 Family history of malignant neoplasm of breast: Secondary | ICD-10-CM

## 2014-02-24 DIAGNOSIS — Z315 Encounter for genetic counseling: Secondary | ICD-10-CM

## 2014-02-24 DIAGNOSIS — Z800 Family history of malignant neoplasm of digestive organs: Secondary | ICD-10-CM

## 2014-02-24 DIAGNOSIS — Z8051 Family history of malignant neoplasm of kidney: Secondary | ICD-10-CM

## 2014-02-24 DIAGNOSIS — Z86010 Personal history of colonic polyps: Secondary | ICD-10-CM

## 2014-02-24 NOTE — Progress Notes (Signed)
REFERRING PROVIDER: Abigail Miyamoto, MD [address], [place] [address]   Uvaldo Rising, MD  PRIMARY PROVIDER:  Abigail Miyamoto, MD  PRIMARY REASON FOR VISIT:  1. Family history of colon cancer   2. History of colonic polyps   3. Family history of breast cancer   4. Family history of kidney cancer      HISTORY OF PRESENT ILLNESS:   Kristy Salas, a 49 y.o. female, was seen for a [facility] cancer genetics consultation at the request of Dr. Toney Rakes due to a family history of cancer.  Kristy Salas presents to clinic today to discuss the possibility of a hereditary predisposition to cancer, genetic testing, and to further clarify her future cancer risks, as well as potential cancer risks for family members.   CANCER HISTORY:   No history exists.     HISTORY OF PRESENT ILLNESS: Kristy Salas is a 49 y.o. female with no personal history of cancer.  Kristy Salas has had two colonoscopies, each finding one polyp.  The polyp in 2010 was a tubular adenoma, however she is not aware of what the one more recently was called.  Kristy Salas's sister, Binnie Kand, reportedly had genetic testing and was negative.  HORMONAL Salas FACTORS:  Menarche was at age 44.  First live birth at age 16.  OCP use for approximately 3 years.  Ovaries intact: yes.  Hysterectomy: no.  Menopausal status: premenopausal.  HRT use: 0 years. Colonoscopy: yes; has had two polyps, the first at age 85. Mammogram within the last year: yes. Number of breast biopsies: 0. Up to date with pelvic exams:  yes. Any excessive radiation exposure in the past:  no  Past Medical History  Diagnosis Date  . NSVD (normal spontaneous vaginal delivery)     times 3  . GDM (gestational diabetes mellitus)   . Menorrhagia   . IBS (irritable bowel syndrome)   . Colon polyp     benign  . Overweight(278.02)   . Colon polyps   . Family history of breast cancer   . Family history of colon cancer   . Family history of kidney cancer      Past Surgical History  Procedure Laterality Date  . Tubal ligation      History   Social History  . Marital Status: Married    Spouse Name: N/A    Number of Children: 3  . Years of Education: N/A   Social History Main Topics  . Smoking status: Former Smoker    Quit date: 04/09/1986  . Smokeless tobacco: Never Used  . Alcohol Use: Yes     Comment: RARELY- SOCIALLY ONLY  . Drug Use: No  . Sexual Activity: Yes    Birth Control/ Protection: Surgical     Comment: TUBAL LIGATION   Other Topics Concern  . None   Social History Narrative     FAMILY HISTORY:  We obtained a detailed, 4-generation family history.  Significant diagnoses are listed below: Family History  Problem Relation Age of Onset  . Cirrhosis Mother     hep c  . Heart disease Father   . Breast cancer Sister     dx in her 37s  . Colon cancer Brother 84  . Breast cancer Maternal Aunt     dx in her 40s  . Colon cancer Maternal Uncle     dx in his 15s  . Kidney cancer Sister 5  . Colon cancer Brother 67  .  Cancer Maternal Aunt     cervical vs. ovarian   Kristy Salas has nine brothers and sisters.  Two brothers had colon cancer, one sister had kidney cancer and one sister had breast cancer.  Kristy Salas mother had 18 brothers and sisters.  One sister had cervical vs. Ovarian cancer, another had breast cancer at 27 and an uncle had colon cancer in his 35s.  Patient's ancestors are of [place] descent. There is no reported Ashkenazi Jewish ancestry. There is no known consanguinity.  GENETIC COUNSELING ASSESSMENT: Kristy Salas is a 50 y.o. female with a personal history of colon polyps and family history of breast, colon, kidney and either cervical or ovarian cancer which somewhat suggestive of a hereditary cancer syndrome and predisposition to cancer. We, therefore, discussed and recommended the following at today's visit.   DISCUSSION: We reviewed the characteristics, features and inheritance patterns of  hereditary cancer syndromes. We discussed several cancer syndromes based on the type of cancer and ages of onset.  BAsed on the early onset breast cancer we reviewed BRCA mutations as well as other hereditary breast cancer syndromes.  The likelihood of a BRCA mutation is low based on the large family and few family members with breast or ovarian cancer.  We discussed CHEK2 mutations as a cause of both breast and colon cancer, PTEN mutations that can result in Breast, kidney and colon cancer, and Lynch syndrome that can cause all of these cancers.  We also discussed genetic testing, including the appropriate family members to test, the process of testing, insurance coverage and turn-around-time for results. We discussed the implications of a negative, positive and/or variant of uncertain significant result. We recommended Kristy Salas pursue genetic testing for the CancerNext-Expanded gene panel. The CancerNext-Expanded gene panel offered by [employer] and includes sequencing and rearrangement analysis for the following 49 genes: APC, ATM, BAP1, BARD1, BMPR1A, BRCA1, BRCA2, BRIP1, CDH1, CDK4, CDKN2A, CHEK2, FH, FLCN, MAX, MEN1, MET, MLH1, MRE11A, MSH2, MSH6, MUTYH, NBN, NF1, PALB2, PMS2, POLD1, POLE, PTEN, RAD50, RAD51D, RET, SDHA, SDHAF2, SDHB, SDHC, SDHD, SMAD4, SMARCA4, STK11, TMEM127, TP53, TSC1, TSC2, and VHL.   PLAN: After considering the risks, benefits, and limitations,Kristy Salas  provided informed consent to pursue genetic testing and the blood sample was sent to [facility] for analysis of the CancerNExt-Expanded. Results should be available within approximately 4-6 weeks' time, at which point they will be disclosed by telephone to Kristy Salas, as will any additional recommendations warranted by these results. Kristy Salas will receive a summary of her genetic counseling visit and a copy of her results once available. This information will also be available in Epic. We encouraged Kristy Salas to  remain in contact with cancer genetics annually so that we can continuously update the family history and inform her of any changes in cancer genetics and testing that may be of benefit for her family. Kristy Salas questions were answered to her satisfaction today. Our contact information was provided should additional questions or concerns arise.  Lastly, we encouraged Kristy Salas to remain in contact with cancer genetics annually so that we can continuously update the family history and inform her of any changes in cancer genetics and testing that may be of benefit for this family.   Ms.  Salas questions were answered to her satisfaction today. Our contact information was provided should additional questions or concerns arise. Thank you for the referral and allowing [place] to share in the care of your patient.   Santiago Glad  Merton Border, MS, [facility] Certified Genetic Counselor [email or web address] phone: [phone]  The patient was seen for a total of 60 minutes in face-to-face genetic counseling.  This patient was discussed with Drs. Magrinat, Lindi Adie and/or Burr Medico who agrees with the above.    _______________________________________________________________________ For Office Staff:  Number of people involved in session: 1 Was an Intern/ student involved with case: no

## 2014-03-24 ENCOUNTER — Telehealth: Payer: Self-pay | Admitting: Genetic Counselor

## 2014-03-24 ENCOUNTER — Encounter: Payer: Self-pay | Admitting: Genetic Counselor

## 2014-03-24 DIAGNOSIS — Z1379 Encounter for other screening for genetic and chromosomal anomalies: Secondary | ICD-10-CM | POA: Insufficient documentation

## 2014-03-24 NOTE — Telephone Encounter (Signed)
LM on VM of cell that we had good news and to please call back starting around 11 AM today.

## 2014-03-25 ENCOUNTER — Telehealth: Payer: Self-pay | Admitting: Genetic Counselor

## 2014-03-25 ENCOUNTER — Encounter: Payer: Self-pay | Admitting: Genetic Counselor

## 2014-03-25 NOTE — Progress Notes (Signed)
HPI: Ms. Thackston was previously seen in the [place] clinic due to a family history of cancer and concerns regarding a hereditary predisposition to cancer. Please refer to our prior cancer genetics clinic note for more information regarding Ms. Huq's medical, social and family histories, and our assessment and recommendations, at the time. Ms. Mullenax's recent genetic test results were disclosed to her, as were recommendations warranted by these results. These results and recommendations are discussed in more detail below.  GENETIC TEST RESULTS: At the time of Ms. Mader's visit, we recommended she pursue genetic testing of the CancerNext-expanded gene panel. The CancerNext-Expanded gene panel offered by [employer] and includes sequencing and rearrangement analysis for the following 49 genes: APC, ATM, BAP1, BARD1, BMPR1A, BRCA1, BRCA2, BRIP1, CDH1, CDK4, CDKN2A, CHEK2, FH, FLCN, MAX, MEN1, MET, MLH1, MRE11A, MSH2, MSH6, MUTYH, NBN, NF1, PALB2, PMS2, POLD1, POLE, PTEN, RAD50, RAD51D, RET, SDHA, SDHAF2, SDHB, SDHC, SDHD, SMAD4, SMARCA4, STK11, TMEM127, TP53, TSC1, TSC2, and VHL.  The report date is 03/23/2014.  Testing was performed at [facility]. Genetic testing was normal, and did not reveal a deleterious mutation in these genes. The test report has been scanned into EPIC and is located under the Media tab.   We discussed with Ms. Sieler that since the current genetic testing is not perfect, it is possible there may be a gene mutation in one of these genes that current testing cannot detect, but that chance is small. We also discussed, that it is possible that another gene that has not yet been discovered, or that we have not yet tested, is responsible for the cancer diagnoses in the family, and it is, therefore, important to remain in touch with cancer genetics in the future so that we can continue to offer Ms. Weinfeld the most up to date genetic testing. Currently, Ms. Graley  and reportedly her sister, have tested negative on a larger cancer panel.  There is also a chance that both sisters have not inherited a hereditary cancer mutation that is running in the family.  We recommend that someone in the family who has cancer get tested to help clarify the hereditary cancer risk.  CANCER SCREENING RECOMMENDATIONS: This result is reassuring and suggests that Ms. Gamino is not at risk for developing cancer due to an inherited predisposition associated with one of these genes.We do not know why there is cancer in Ms. Liera's family.   Therefore,we recommended she continue to follow the cancer management and screening guidelines provided by her oncology and primary providers that has been put in place based on her family history of cancer.   RECOMMENDATIONS FOR FAMILY MEMBERS: Women in this family might be at some increased risk of developing cancer, over the general population risk, simply due to the family history of cancer. We recommended women in this family have a yearly mammogram beginning at age 40, or 81 years younger than the earliest onset of cancer, an an annual clinical breast exam, and perform monthly breast self-exams. Women in this family should also have a gynecological exam as recommended by their primary provider. All family members should have a colonoscopy by age 47.  FOLLOW-UP: Lastly, we discussed with Ms. Martinovich that cancer genetics is a rapidly advancing field and it is possible that new genetic tests will be appropriate for her and/or her family members in the future. We encouraged her to remain in contact with cancer genetics on an annual basis so we can update her personal and  family histories and let her know of advances in cancer genetics that may benefit this family.   Our contact number was provided. Ms. Turri's questions were answered to her satisfaction, and she knows she is welcome to call us at anytime with additional questions or concerns.   Roma Kayser, MS, [facility] Certified Genetic Counselor [email or web address]

## 2014-03-25 NOTE — Telephone Encounter (Signed)
Revealed negative genetic testing on the CancerNExt genetics panel.  Explained that she and (reportedly) her sister were negative on genetic testing panels.  Both of these individuals do not have cancer. Therefore we do not know why there is cancer in the family - is it because we have not tested for the right gene, were these individuals exposed to something that the patient and her sister were not, or is there a cancer gene running in the family that the patient and her sister did not inherit?  We would recommend that Kristy Salas continue following the cancer screening protocols put in place by her family history of cancer.

## 2014-04-16 ENCOUNTER — Encounter: Payer: Self-pay | Admitting: Oncology

## 2014-10-02 ENCOUNTER — Telehealth: Payer: Self-pay | Admitting: Obstetrics & Gynecology

## 2014-10-02 NOTE — Telephone Encounter (Signed)
Very nice 49 yo called with complaint of about 18 hours of left mid/left back pain that has now changed and migrated to left groin pain and upper lef leg pain/ache.  Never felt like she could get comfortable last night.  Feels like she has finally gotten more comfortable now.  No fever.  No nausea, diarrhea, constipation.   No urinary symptoms.  No irregular bleeding.  Got on Web MD and is worried this could be a clot or degenerative muscular disorder.  No calf pain.  No lower extremity swelling.   Advised I feel this is more consistent with a renal stone.    History of a few small fibroids and endometrial ablation.  Not on any current medications.  Pt really does not want to go to the ER.  Advised to increase fluids.  She has percocet and I feel one every six hours would be okay.  Highly encouraged her to be seen if pain returns like it was or if she starts to have any elevated temperature.  If pain is present even with pain medications, also advised she should be seen.  Advised to be careful to not mask pain when we don't know exact cause  Pt aware i am on call for weekend and can call back with questions.  Advised to go to ER and not women's or urgent care if goes for evaluation as feel pt will need CT.  Pt voiced clear understanding.

## 2014-10-05 ENCOUNTER — Ambulatory Visit (INDEPENDENT_AMBULATORY_CARE_PROVIDER_SITE_OTHER): Payer: BLUE CROSS/BLUE SHIELD | Admitting: Gynecology

## 2014-10-05 ENCOUNTER — Encounter: Payer: Self-pay | Admitting: Gynecology

## 2014-10-05 VITALS — BP 130/82 | Ht 63.0 in | Wt 233.0 lb

## 2014-10-05 DIAGNOSIS — Z01419 Encounter for gynecological examination (general) (routine) without abnormal findings: Secondary | ICD-10-CM

## 2014-10-05 DIAGNOSIS — R635 Abnormal weight gain: Secondary | ICD-10-CM | POA: Diagnosis not present

## 2014-10-05 DIAGNOSIS — Z8632 Personal history of gestational diabetes: Secondary | ICD-10-CM | POA: Diagnosis not present

## 2014-10-05 DIAGNOSIS — E781 Pure hyperglyceridemia: Secondary | ICD-10-CM | POA: Diagnosis not present

## 2014-10-05 LAB — URINALYSIS W MICROSCOPIC + REFLEX CULTURE
Bilirubin Urine: NEGATIVE
Casts: NONE SEEN [LPF]
Crystals: NONE SEEN [HPF]
Glucose, UA: NEGATIVE
Hgb urine dipstick: NEGATIVE
Ketones, ur: NEGATIVE
Leukocytes, UA: NEGATIVE
Nitrite: NEGATIVE
Protein, ur: NEGATIVE
RBC / HPF: NONE SEEN RBC/HPF
Specific Gravity, Urine: 1.015 (ref 1.001–1.035)
WBC, UA: NONE SEEN WBC/HPF
Yeast: NONE SEEN [HPF]
pH: 6.5 (ref 5.0–8.0)

## 2014-10-05 NOTE — Progress Notes (Signed)
Kristy Salas 07-12-1965 [id number]   History:    49 y.o.  for annual gyn exam who this weekend had called the answering service and talked  to one of my colleagues who was on-call in reference to her left flank and left sided pain. She denied any dysuria or frequency. She denies any fever, chills, nausea or vomiting. Her symptoms since then have subsided almost completely gone now. She did not notice any blood in her urine. Patient also last year had been evaluated due to the fact that she had had some irregular bleeding and had a benign endometrial biopsy and no intracavitary defect on sonohysterogram only 3 small fibroids. She reports no irregular bleeding.  She had been referred to the [facility] geneticist early this year as a result of her strong family history of the wrist types of cancer.Her sister had breast cancer in her 60s and has not been tested for BRCA1 and BRCA2 gene mutation. Another sister had kidney cancer and 2 brothers with colon cancer in their 10s. Patient has had history of benign colon polyps removed in the past. Her last colonoscopy was reported to be normal in 2015. Her cancer genetic panel was reported to be negative. She has had issues with her insurance carrier because of coverage for the much needed testing because of her family history.  Patient had been started on a diet and exercise program because of her hypertriglyceridemia.  Past medical history,surgical history, family history and social history were all reviewed and documented in the EPIC chart.  Gynecologic History No LMP recorded. Patient has had an ablation. Contraception: tubal ligation Last Pap: 2015. Results were: normal Last mammogram: 2015. Results were: normal  Obstetric History OB History  Gravida Para Term Preterm AB SAB TAB Ectopic Multiple Living  '3 3        3    ' # Outcome Date GA Lbr Len/2nd Weight Sex Delivery Anes PTL Lv  3 Para           2 Para           1 Para                 ROS: A ROS was performed and pertinent positives and negatives are included in the history.  GENERAL: No fevers or chills. HEENT: No change in vision, no earache, sore throat or sinus congestion. NECK: No pain or stiffness. CARDIOVASCULAR: No chest pain or pressure. No palpitations. PULMONARY: No shortness of breath, cough or wheeze. GASTROINTESTINAL: No abdominal pain, nausea, vomiting or diarrhea, melena or bright red blood per rectum. GENITOURINARY: No urinary frequency, urgency, hesitancy or dysuria. MUSCULOSKELETAL: No joint or muscle pain, no back pain, no recent trauma. DERMATOLOGIC: No rash, no itching, no lesions. ENDOCRINE: No polyuria, polydipsia, no heat or cold intolerance. No recent change in weight. HEMATOLOGICAL: No anemia or easy bruising or bleeding. NEUROLOGIC: No headache, seizures, numbness, tingling or weakness. PSYCHIATRIC: No depression, no loss of interest in normal activity or change in sleep pattern.     Exam: chaperone present  BP 130/82 mmHg  Ht '5\' 3"'  (1.6 m)  Wt 233 lb (105.688 kg)  BMI 41.28 kg/m2  Body mass index is 41.28 kg/(m^2).  General appearance : Well developed well nourished female. No acute distress HEENT: Eyes: no retinal hemorrhage or exudates,  Neck supple, trachea midline, no carotid bruits, no thyroidmegaly Lungs: Clear to auscultation, no rhonchi or wheezes, or rib retractions  Heart: Regular rate and rhythm,  no murmurs or gallops Breast:Examined in sitting and supine position were symmetrical in appearance, no palpable masses or tenderness,  no skin retraction, no nipple inversion, no nipple discharge, no skin discoloration, no axillary or supraclavicular lymphadenopathy Abdomen: no palpable masses or tenderness, no rebound or guarding Extremities: no edema or skin discoloration or tenderness  Pelvic:  Bartholin, Urethra, Skene Glands: Within normal limits             Vagina: No gross lesions or discharge  Cervix: No gross  lesions or discharge  Uterus  anteverted, normal size, shape and consistency, non-tender and mobile  Adnexa  Without masses or tenderness  Anus and perineum  normal   Rectovaginal  normal sphincter tone without palpated masses or tenderness             Hemoccult cards provided     Assessment/Plan:  49 y.o. female for annual exam with strong family history of multiple types of cancer had genetic counseling this year and her cancer genetic panel was reported to be negative. She was reminded to continue to do her monthly breast exam. She was reminded to schedule her mammogram. She was reminded to submit to the office the fecal Hemoccult cards for testing. Her urinalysis today was negative with the exception of few bacteria will check a urine culture. The etiology for her past left back and side pain this weekend unknown. Possibly she may passed a kidney stone. On leg raises she did not experience any back pain. She had no back tenderness on examination. We will continue to monitor of her symptoms return she will contact the office. She will return back this week in a fasting state for the following blood tests: Comprehensive metabolic panel, TSH, CBC, fasting lipid profile.   Terrance Mass MD, 5:03 PM 10/05/2014

## 2014-10-06 LAB — URINE CULTURE
COLONY COUNT: NO GROWTH
ORGANISM ID, BACTERIA: NO GROWTH

## 2014-11-12 ENCOUNTER — Encounter: Payer: Self-pay | Admitting: Gynecology

## 2015-06-01 ENCOUNTER — Ambulatory Visit (INDEPENDENT_AMBULATORY_CARE_PROVIDER_SITE_OTHER): Payer: 59 | Admitting: Gynecology

## 2015-06-01 ENCOUNTER — Encounter: Payer: Self-pay | Admitting: Gynecology

## 2015-06-01 VITALS — BP 136/88

## 2015-06-01 DIAGNOSIS — R159 Full incontinence of feces: Secondary | ICD-10-CM | POA: Insufficient documentation

## 2015-06-01 DIAGNOSIS — N3942 Incontinence without sensory awareness: Secondary | ICD-10-CM

## 2015-06-01 DIAGNOSIS — F32A Depression, unspecified: Secondary | ICD-10-CM | POA: Insufficient documentation

## 2015-06-01 DIAGNOSIS — M6289 Other specified disorders of muscle: Secondary | ICD-10-CM | POA: Diagnosis not present

## 2015-06-01 DIAGNOSIS — F329 Major depressive disorder, single episode, unspecified: Secondary | ICD-10-CM | POA: Diagnosis not present

## 2015-06-01 DIAGNOSIS — R269 Unspecified abnormalities of gait and mobility: Secondary | ICD-10-CM | POA: Diagnosis not present

## 2015-06-01 NOTE — Patient Instructions (Signed)
Multiple Sclerosis °Multiple sclerosis (MS) is a disease of the central nervous system. It leads to the loss of the insulating covering of the nerves (myelin sheath) of your brain. When this happens, brain signals do not get sent properly or may not get sent at all. The age of onset of MS varies.  °CAUSES °The cause of MS is unknown. However, it is more common in the northern [place] than in the southern [place]. °RISK FACTORS °There is a higher number of women with MS than men. MS is not an illness that is passed down to you from your family members (inherited). However, your risk of MS is higher if you have a relative with MS. °SIGNS AND SYMPTOMS  °The symptoms of MS occur in episodes or attacks. These attacks may last weeks to months. There may be long periods of almost no symptoms between attacks. The symptoms of MS vary. This is because of the many different ways it affects the central nervous system. The main symptoms of MS include: °· Vision problems and eye pain. °· Numbness. °· Weakness. °· Inability to move your arms, hands, feet, or legs (paralysis). °· Balance problems. °· Tremors. °DIAGNOSIS  °Your health care provider can diagnose MS with the help of imaging exams and lab tests. These may include specialized X-ray exams and spinal fluid tests. The best imaging exam to confirm a diagnosis of MS is an MRI. °TREATMENT  °There is no known cure for MS, but there are medicines that can decrease the number and frequency of attacks. Steroids are often used for short-term relief. Physical and occupational therapy may also help. There are also many new alternative or complementary treatments available to help control the symptoms of MS. Ask your health care provider if any of these other options are right for you. °HOME CARE INSTRUCTIONS  °· Take medicines as directed by your health care provider. °· Exercise as directed by your health care provider. °SEEK MEDICAL CARE IF: °You begin to feel  depressed. °SEEK IMMEDIATE MEDICAL CARE IF: °· You develop paralysis. °· You have problems with bladder, bowel, or sexual function. °· You develop mental changes, such as forgetfulness or mood swings. °· You have a period of uncontrolled movements (seizure). °  °This information is not intended to replace advice given to you by your health care provider. Make sure you discuss any questions you have with your health care provider. °  °Document Released: 01/27/2000 Document Revised: 02/03/2013 Document Reviewed: 10/06/2012 °Elsevier Interactive Patient Education ©2016 Elsevier Inc[employer] ° °

## 2015-06-01 NOTE — Progress Notes (Signed)
   Patient is a 50 year old who presented to the office today with several complaints. One being the following:  Patient feels a time that she loses her balance but never falls has to hold onto something so she does not fall. Patient complains at times tingling in her hands and her feet Patient complains of exhaustion and weakness with minimal activity and especially going up a flight of stairs no shortness of breath no palpitations in her chest Patient feels at times depressed other her child now is the last one to leave to go away for college next month. Patient also suffers at times with leakage of urine from her bladder with minimal activity unit from standing from a sitting position and 2 times last year one time this year she had stool incontinence just when walking. Patient also has noted decreased libido Patient describes decrease in smell but no change in taste or any hearing Patient also has described forgetfulness  With the above symptoms described a simple neurological examination today demonstrated cranial nerves II through XII intact no gross evidence of motor or sensory deficit. We are going to refer her to the neurologist for further evaluation to rule out the possibility of multiple sclerosis who will possibly order an MRI as part of his evaluation. I provided her with literature information. Patient was not interested the present time for any medication up with depression and anxiety did affect that she is getting rate experienced emptiness syndrome after last chocolates off to college.  Greater than 50% time was spent in counseling coordinate and care for this patient with the above mentioned signs and symptoms of possible MS. Time of consultation 15 minutes.

## 2015-06-02 ENCOUNTER — Telehealth: Payer: Self-pay | Admitting: *Deleted

## 2015-06-02 DIAGNOSIS — R269 Unspecified abnormalities of gait and mobility: Secondary | ICD-10-CM

## 2015-06-02 DIAGNOSIS — M6289 Other specified disorders of muscle: Secondary | ICD-10-CM

## 2015-06-02 NOTE — Telephone Encounter (Signed)
-----   Message from Terrance Mass, MD sent at 06/01/2015  3:58 PM EDT ----- Please make appointment with Dr. Melton Alar neurologist for this patient with S & S of possible MS

## 2015-06-02 NOTE — Telephone Encounter (Signed)
I called Dr.Lewiit office and he no longer accepts patient for the below. I will place referral in epic for a [facility] physician to see patient. They will contact pt to schedule.

## 2015-06-16 ENCOUNTER — Ambulatory Visit (INDEPENDENT_AMBULATORY_CARE_PROVIDER_SITE_OTHER): Payer: 59 | Admitting: Neurology

## 2015-06-16 ENCOUNTER — Encounter: Payer: Self-pay | Admitting: Neurology

## 2015-06-16 VITALS — BP 132/76 | HR 70 | Resp 18 | Ht 63.0 in | Wt 231.5 lb

## 2015-06-16 DIAGNOSIS — R413 Other amnesia: Secondary | ICD-10-CM

## 2015-06-16 DIAGNOSIS — M6289 Other specified disorders of muscle: Secondary | ICD-10-CM | POA: Diagnosis not present

## 2015-06-16 DIAGNOSIS — R200 Anesthesia of skin: Secondary | ICD-10-CM | POA: Diagnosis not present

## 2015-06-16 DIAGNOSIS — G4710 Hypersomnia, unspecified: Secondary | ICD-10-CM

## 2015-06-16 DIAGNOSIS — F32A Depression, unspecified: Secondary | ICD-10-CM

## 2015-06-16 DIAGNOSIS — R0683 Snoring: Secondary | ICD-10-CM

## 2015-06-16 DIAGNOSIS — N319 Neuromuscular dysfunction of bladder, unspecified: Secondary | ICD-10-CM | POA: Diagnosis not present

## 2015-06-16 DIAGNOSIS — F329 Major depressive disorder, single episode, unspecified: Secondary | ICD-10-CM

## 2015-06-16 DIAGNOSIS — R159 Full incontinence of feces: Secondary | ICD-10-CM

## 2015-06-16 DIAGNOSIS — R269 Unspecified abnormalities of gait and mobility: Secondary | ICD-10-CM | POA: Diagnosis not present

## 2015-06-16 NOTE — Progress Notes (Signed)
[facility]  PATIENT: Kristy Salas DOB: [birth year]  REFERRING DOCTOR OR PCP:   Uvaldo [facility]) SOURCE: patient, notes from Dr. Toney Rakes and Roma Kayser [facility] Counselor), labs in EMR  _________________________________   HISTORICAL  CHIEF COMPLAINT:  Chief Complaint  Patient presents with  . Anxiety    Kristy Salas sts. she has a coworker who was recently dx. with MS.  Sts. coworker suggested she see neurologist becuase they had some of the same sx.  Sts. she has increased difficulty with memory.  Sts. she has hx. of  bilat CTS and tingling is becoming more frequent.  Bladder urgency and difficulty stopping stream once it starts. 2 episodes of bowel incontinence in the lsat 8 mos.  She c/o chronic fatigue. Difficulty staying asleep. Progressive worsening of vision bilat. Sts. she is overly emotional--but has  . Depression    a reason to be--her dtr. is moving to [place] for grad school, and her youngest child is graduating high school and going to college 4 hrs. away. Decreased libido.  Intermittent back pain that radiates down ? left leg./fim    HISTORY OF PRESENT ILLNESS:  I had the pleasure of seeing your patient, Kristy Salas, for a neurologic consultation regarding her dizziness, numbness and other symptoms.    She has a co-worker recently diagnosed with MS and Kristy Salas is concerned about the diagnosis as some symptoms are similar.      Balance/dizziness:   For the last 6 months she has had episodes where balance is off and she needs to hold on to prevent herself from falling.  Numbness:  She gets episodes of numbness in either hand or foot lasting for minutes at a time.  Weakness/Fatoigue:   Legs seem weak to her at times.   She gets tired out easily and fatigues very easily.  Vision/smell:    She needed to get reading glasses recently and feels the rate of change of her vision was very rapid.   Her sense of smell seems worse.  She recounts recently when a client  in her office had bad odor according to others but she did not notice.  Things  do not taste differently and she still smells food as she cooks.     Bladder/Bowel:   She has a lot of urinary leakage, worse the last year.   She now wears a pad at all times.   She also has had two bouts of bowel incontinence without warning the past few months.   A colonoscopy recently showed one polyp.     Mood:   She denies depression but notes that she is feeling bad with hr youngest going off to college soon and her daughter going to grad school in [place].    She has been tearful and does so when she talks about this.   She has decreased libido but notes a reduced body image.      Cognition/memory:   She has had multiple episodes at work where she has been forgetful.  Sleep:  She falls asleep easily but notes that if she gets a few hours of sleep and then wakes up she will not be able to fall back asleep.   She snores and her son has witnessed her having pauses in breathing and snorting.     EPWORTH SLEEPINESS SCALE  On a scale of 0 - 3 what is the chance of dozing:  Sitting and Reading:   3 Watching TV:    3 Sitting  inactive in a public place: 0 Passenger in car for one hour: 0 Lying down to rest in the afternoon: 3 Sitting and talking to someone: 1 Sitting quietly after lunch:  2 In a car, stopped in traffic:  0  Total (out of 24):   12/24   REVIEW OF SYSTEMS: Constitutional: No fevers, chills, sweats, or change in appetite.   She notes fatigue Eyes: No visual changes, double vision, eye pain Ear, nose and throat: No hearing loss, ear pain, nasal congestion, sore throat.   She snores  Cardiovascular: No chest pain, palpitations Respiratory: No shortness of breath at rest or with exertion.   No wheezes GastrointestinaI: No nausea, vomiting, diarrhea, abdominal pain.   She has fecal incontinence x 2 Genitourinary: She has had urinary incontinence. Musculoskeletal: No neck pain, back  pain Integumentary: No rash, pruritus, skin lesions Neurological: as above Psychiatric: No depression at this time.  No anxiety Endocrine: No palpitations, diaphoresis, change in appetite, change in weigh or increased thirst Hematologic/Lymphatic: No anemia, purpura, petechiae. Allergic/Immunologic: No itchy/runny eyes, nasal congestion, recent allergic reactions, rashes  ALLERGIES: No Known Allergies  HOME MEDICATIONS:  Current outpatient prescriptions:  Marland Kitchen  Multiple Vitamins-Minerals (MULTIVITAMIN PO), Take by mouth. Reported on 06/01/2015, Disp: , Rfl:   Current facility-administered medications:  .  lidocaine (XYLOCAINE) 1 % (with pres) injection 10 mL, 10 mL, Infiltration, Once, Terrance Mass, MD  PAST MEDICAL HISTORY: Past Medical History  Diagnosis Date  . NSVD (normal spontaneous vaginal delivery)     times 3  . GDM (gestational diabetes mellitus)   . Menorrhagia   . IBS (irritable bowel syndrome)   . Colon polyp     benign  . Overweight(278.02)   . Colon polyps     PAST SURGICAL HISTORY: Past Surgical History  Procedure Laterality Date  . Tubal ligation    . Finger surgery      FAMILY HISTORY: Family History  Problem Relation Age of Onset  . Cirrhosis Mother     hep c  . Heart disease Father   . Breast cancer Sister     dx in her 17s  . Colon cancer Brother 14  . Breast cancer Maternal Aunt     dx in her 31s  . Colon cancer Maternal Uncle     dx in his 68s  . Kidney cancer Sister 29  . Colon cancer Brother 31  . Cancer Maternal Aunt     cervical vs. ovarian    SOCIAL HISTORY:  Social History   Social History  . Marital Status: Married    Spouse Name: N/A  . Number of Children: 3  . Years of Education: N/A   Occupational History  . Not on file.   Social History Main Topics  . Smoking status: Former Smoker    Quit date: 04/09/1986  . Smokeless tobacco: Never Used  . Alcohol Use: 0.0 oz/week    0 Standard drinks or equivalent per  week     Comment: RARELY- SOCIALLY ONLY  . Drug Use: No  . Sexual Activity: Yes    Birth Control/ Protection: Surgical     Comment: TUBAL LIGATION-1st intercourse 50 yo-Fewer than 5 partners   Other Topics Concern  . Not on file   Social History Narrative     PHYSICAL EXAM  Filed Vitals:   06/16/15 1418  BP: 132/76  Pulse: 70  Resp: 18  Height: 5\' 3"  (1.6 m)  Weight: 231 lb 8 oz (105.008 kg)  Body mass index is 41.02 kg/(m^2).   General: The patient is well-developed and well-nourished and in no acute distress  HEENT:  Head is normocephalic and atraumatic.  Pharynx is nonerythematous. She is Mallampati 2.   Funduscopic exam shows normal optic discs and retinal vessels.  Neck: The neck is supple, no carotid bruits are noted.  The neck is nontender.  Cardiovascular: The heart has a regular rate and rhythm with a normal S1 and S2. There were no murmurs, gallops or rubs. Lungs are clear to auscultation.  Skin: Extremities are without significant edema.  Musculoskeletal:  Back is nontender  Neurologic Exam  Mental status: The patient is alert and oriented x 3 at the time of the examination. The patient has apparent normal recent and remote memory, with an apparently normal attention span and concentration ability.   Speech is normal.  Cranial nerves: Extraocular movements are full. Pupils are equal, round, and reactive to light and accomodation.  Visual fields are full.  Facial symmetry is present. There is good facial sensation to soft touch bilaterally.Facial strength is normal.  Trapezius and sternocleidomastoid strength is normal. No dysarthria is noted.  The tongue is midline, and the patient has symmetric elevation of the soft palate. No obvious hearing deficits are noted.  Motor:  Muscle bulk is normal.   Tone is normal. Strength is  5 / 5 in all 4 extremities.   Sensory: Sensory testing is intact to pinprick, soft touch and vibration sensation in all 4  extremities.  Coordination: Cerebellar testing reveals good finger-nose-finger and heel-to-shin bilaterally.  Gait and station: Station is normal.   Gait is normal. Tandem gait is mildly wide. Romberg is negative.   Reflexes: Deep tendon reflexes are symmetric and normal bilaterally.   Plantar responses are flexor.    DIAGNOSTIC DATA (LABS, IMAGING, TESTING) - I reviewed patient records, labs, notes, testing and imaging myself where available.  Lab Results  Component Value Date   WBC 6.4 08/28/2013   HGB 14.0 08/28/2013   HCT 40.2 08/28/2013   MCV 91.6 08/28/2013   PLT 275 08/28/2013      Component Value Date/Time   NA 139 09/17/2013 0907   K 4.3 09/17/2013 0907   CL 103 09/17/2013 0907   CO2 26 09/17/2013 0907   GLUCOSE 95 09/17/2013 0907   BUN 15 09/17/2013 0907   CREATININE 0.70 09/17/2013 0907   CALCIUM 9.5 09/17/2013 0907   PROT 7.1 09/17/2013 0907   ALBUMIN 4.1 09/17/2013 0907   AST 16 09/17/2013 0907   ALT 20 09/17/2013 0907   ALKPHOS 87 09/17/2013 0907   BILITOT 1.0 09/17/2013 0907   Lab Results  Component Value Date   CHOL 187 09/17/2013   HDL 51 09/17/2013   LDLCALC 97 09/17/2013   TRIG 196* 09/17/2013   CHOLHDL 3.7 09/17/2013   Lab Results  Component Value Date   HGBA1C 5.4 05/01/2012   No results found for: VITAMINB12 Lab Results  Component Value Date   TSH 2.119 08/28/2013       ASSESSMENT AND PLAN  Gait disturbance - Plan: MR Brain W Wo Contrast, MR Cervical Spine Wo Contrast  Numbness - Plan: Vitamin B12, MR Brain W Wo Contrast, MR Cervical Spine Wo Contrast  Bladder dysfunction - Plan: MR Brain W Wo Contrast, MR Cervical Spine Wo Contrast  Memory change - Plan: VITAMIN D 25 Hydroxy (Vit-D Deficiency, Fractures), Vitamin B12, TSH  Muscle tiredness - Plan: TSH  Rectal incontinence  Depression - Plan: VITAMIN D  25 Hydroxy (Vit-D Deficiency, Fractures), TSH  Snoring - Plan: Split night study  Excessive sleepiness - Plan: Split  night study    In summary, Kristy Salas is a 50 year old woman who has multiple mild neurologic and somatic symptoms.    Many of her symptoms are intermittent and lasting minutes at a time, not a time course typical for MS. However, I am more concerned that she doesn't have urinary as well as bowel incontinence as these could be caused by a neurologic issue. Additionally, she has some difficulty with her balance. Therefore, we need to obtain an MRI of the brain and MRI of the cervical spine to better evaluate to make sure that she does not have multiple sclerosis, strokes, myelopathy or other structural issue.  She also reports memory difficulties, some muscle aches and fatigue and sleepiness. She does have snoring and I feel we need to do a sleep study to evaluate for the possibility of obstructive sleep apnea that might explain the symptoms. Additionally I'll check vitamin B12, vitamin D and TSH. As metabolic issues could also cause the symptoms.  She will return to see me in 2 months but call sooner if she has new or worsening neurologic symptoms. We will call her with the results of the testing as they are done and bring her back sooner if needed based on results.  Thank you for asking me to see Kristy Salas for a neurologic consultation. Please let me know if I can be of further assistance with her or other patients in the future.   Kristy Salas A. [name], MD, PhD [id number], [id number] PM Certified in Neurology, Clinical Neurophysiology, Sleep Medicine, Pain Medicine and Neuroimaging  [facility] [address] [place], [place] [address] [phone]

## 2015-06-16 NOTE — Telephone Encounter (Signed)
appt on 06/16/15 @ 2:00pm with Dr.Sater.

## 2015-06-20 ENCOUNTER — Telehealth: Payer: Self-pay | Admitting: *Deleted

## 2015-06-20 LAB — VITAMIN D 25 HYDROXY (VIT D DEFICIENCY, FRACTURES): Vit D, 25-Hydroxy: 16.1 ng/mL — ABNORMAL LOW (ref 30.0–100.0)

## 2015-06-20 LAB — VITAMIN B12: VITAMIN B 12: 299 pg/mL (ref 211–946)

## 2015-06-20 LAB — TSH: TSH: 2.12 u[IU]/mL (ref 0.450–4.500)

## 2015-06-20 MED ORDER — VITAMIN D (ERGOCALCIFEROL) 1.25 MG (50000 UNIT) PO CAPS: 50000.0000 [IU] | ORAL_CAPSULE | ORAL | Status: DC

## 2015-06-20 NOTE — Telephone Encounter (Signed)
-----   Message from Britt Bottom, MD sent at 06/20/2015  8:35 AM EDT ----- Her vitamin D is low, lets do 50,000 units weekly 12 weeks then 5000 units OTC daily.

## 2015-06-20 NOTE — Telephone Encounter (Signed)
I have spoken with Kristy Salas this afternoon, and per RAS, advised that vit. d level is low--she should take rx. vit. d 50,000iu weekly for 12 weeks, then take otc vit. d 5,000iu daily.  She verbalized understanding of same.  Rx. escribed to [facility] per her request/fim

## 2015-06-28 ENCOUNTER — Telehealth: Payer: Self-pay | Admitting: Neurology

## 2015-06-28 NOTE — Telephone Encounter (Signed)
LMOM (identified vm) that it is ok to donate blood same day as mri (as long as it's ok with the [employer]--but from our perspective--it's ok)/fim

## 2015-06-28 NOTE — Telephone Encounter (Signed)
Message For: ON CALL              Taken 16-MAY-17 at  1:37PM by DLR ------------------------------------------------------------  Kristy Salas                 CID  [id number]   Patient  SAME                  Pt's Dr  [name]         Area Code  336  Phone#  [phone]      RE  DONATING BLOOD @ WORK TOMORROW, BUT PT HAS AN     MRI TOMORROW,WANTS TO MAKE SURE ITS OK TO DONATE      Disp:Y/N  Y  If Y = C/B If No Response In 91minutes  ============================================================

## 2015-06-29 ENCOUNTER — Ambulatory Visit (INDEPENDENT_AMBULATORY_CARE_PROVIDER_SITE_OTHER): Payer: 59

## 2015-06-29 ENCOUNTER — Ambulatory Visit
Admission: RE | Admit: 2015-06-29 | Discharge: 2015-06-29 | Disposition: A | Discharge status: Home / Self Care | Payer: 59 | Source: Ambulatory Visit | Attending: Neurology | Admitting: Neurology

## 2015-06-29 ENCOUNTER — Other Ambulatory Visit: Payer: Self-pay | Admitting: *Deleted

## 2015-06-29 DIAGNOSIS — Z9889 Other specified postprocedural states: Secondary | ICD-10-CM

## 2015-06-29 DIAGNOSIS — N319 Neuromuscular dysfunction of bladder, unspecified: Secondary | ICD-10-CM

## 2015-06-29 DIAGNOSIS — R269 Unspecified abnormalities of gait and mobility: Secondary | ICD-10-CM

## 2015-06-29 DIAGNOSIS — R200 Anesthesia of skin: Secondary | ICD-10-CM

## 2015-06-30 MED ORDER — GADOPENTETATE DIMEGLUMINE 469.01 MG/ML IV SOLN: 20.0000 mL | Freq: Once | INTRAVENOUS | Status: AC | PRN

## 2015-07-04 ENCOUNTER — Telehealth: Payer: Self-pay | Admitting: *Deleted

## 2015-07-04 NOTE — Telephone Encounter (Signed)
-----   Message from Britt Bottom, MD sent at 07/01/2015  6:51 PM EDT ----- Please let her know that the MRI of the brain and cervical spine are unchanged from her last one.

## 2015-07-04 NOTE — Telephone Encounter (Signed)
I have spoken with Kristy Salas and per RAS, advised that mri brain is normal, mri c-spine shows mild disc bulges at C4-5, 5-6, but they are not pressing on the spinal cord; the spinal cord is normal.  She verbalized understanding of same/fim

## 2015-07-21 ENCOUNTER — Telehealth: Payer: Self-pay | Admitting: Neurology

## 2015-07-21 NOTE — Telephone Encounter (Signed)
Split study is denied by [facility].  However a HST is approved without needed authorization.  Can I get an order for HST?

## 2015-07-25 ENCOUNTER — Other Ambulatory Visit: Payer: Self-pay | Admitting: *Deleted

## 2015-07-25 DIAGNOSIS — R0683 Snoring: Secondary | ICD-10-CM

## 2015-07-25 DIAGNOSIS — R0681 Apnea, not elsewhere classified: Secondary | ICD-10-CM

## 2015-07-25 DIAGNOSIS — G4719 Other hypersomnia: Secondary | ICD-10-CM

## 2015-08-18 ENCOUNTER — Ambulatory Visit: Payer: 59 | Admitting: Neurology

## 2015-08-18 ENCOUNTER — Encounter (INDEPENDENT_AMBULATORY_CARE_PROVIDER_SITE_OTHER): Payer: 59 | Admitting: Neurology

## 2015-08-18 DIAGNOSIS — G4719 Other hypersomnia: Secondary | ICD-10-CM

## 2015-08-18 DIAGNOSIS — R0681 Apnea, not elsewhere classified: Secondary | ICD-10-CM

## 2015-08-18 DIAGNOSIS — R0683 Snoring: Secondary | ICD-10-CM

## 2015-08-18 DIAGNOSIS — G4710 Hypersomnia, unspecified: Secondary | ICD-10-CM

## 2015-08-23 ENCOUNTER — Telehealth: Payer: Self-pay | Admitting: *Deleted

## 2015-08-23 ENCOUNTER — Ambulatory Visit: Payer: 59 | Admitting: Neurology

## 2015-08-23 NOTE — Telephone Encounter (Signed)
Records faxed to Dr. Jilda Panda on 08/23/2015.

## 2015-08-30 ENCOUNTER — Telehealth: Payer: Self-pay | Admitting: *Deleted

## 2015-08-30 NOTE — Telephone Encounter (Signed)
-----   Message from Britt Bottom, MD sent at 08/30/2015  8:52 AM EDT ----- Please let her know that the sleep study did not show any significant sleep apnea. If her snoring is troublesome to her or others, we can refer for an oral appliance.

## 2015-08-30 NOTE — Telephone Encounter (Signed)
LMOM that per RAS, sleep study did not show any sig. osa.  If snoring bothers her, he can refer her to a dentist who is board certified to make oral appliances.  She does not need to return this call unless she would like referral, or has other questions./fim

## 2016-03-15 ENCOUNTER — Encounter: Payer: 59 | Admitting: Gynecology

## 2016-03-20 ENCOUNTER — Ambulatory Visit: Payer: 59 | Admitting: Dietician

## 2016-03-20 ENCOUNTER — Encounter: Payer: 59 | Admitting: Gynecology

## 2016-05-02 ENCOUNTER — Encounter: Payer: Self-pay | Admitting: Gynecology

## 2016-05-02 ENCOUNTER — Ambulatory Visit: Payer: 59 | Admitting: Registered"

## 2016-05-02 ENCOUNTER — Ambulatory Visit (INDEPENDENT_AMBULATORY_CARE_PROVIDER_SITE_OTHER): Payer: 59 | Admitting: Gynecology

## 2016-05-02 ENCOUNTER — Encounter: Payer: 59 | Attending: Internal Medicine | Admitting: *Deleted

## 2016-05-02 VITALS — BP 120/76 | Ht 63.0 in | Wt 227.0 lb

## 2016-05-02 DIAGNOSIS — Z713 Dietary counseling and surveillance: Secondary | ICD-10-CM | POA: Diagnosis present

## 2016-05-02 DIAGNOSIS — E663 Overweight: Secondary | ICD-10-CM | POA: Diagnosis not present

## 2016-05-02 DIAGNOSIS — N951 Menopausal and female climacteric states: Secondary | ICD-10-CM | POA: Diagnosis not present

## 2016-05-02 DIAGNOSIS — R635 Abnormal weight gain: Secondary | ICD-10-CM

## 2016-05-02 DIAGNOSIS — Z01419 Encounter for gynecological examination (general) (routine) without abnormal findings: Secondary | ICD-10-CM

## 2016-05-02 LAB — COMPREHENSIVE METABOLIC PANEL
ALK PHOS: 98 U/L (ref 33–115)
ALT: 26 U/L (ref 6–29)
AST: 22 U/L (ref 10–35)
Albumin: 4.3 g/dL (ref 3.6–5.1)
BILIRUBIN TOTAL: 1.2 mg/dL (ref 0.2–1.2)
BUN: 14 mg/dL (ref 7–25)
CO2: 26 mmol/L (ref 20–31)
Calcium: 9.7 mg/dL (ref 8.6–10.2)
Chloride: 105 mmol/L (ref 98–110)
Creat: 0.67 mg/dL (ref 0.50–1.10)
Glucose, Bld: 88 mg/dL (ref 65–99)
Potassium: 5.1 mmol/L (ref 3.5–5.3)
SODIUM: 140 mmol/L (ref 135–146)
Total Protein: 7.1 g/dL (ref 6.1–8.1)

## 2016-05-02 LAB — LIPID PANEL
Cholesterol: 172 mg/dL (ref <200)
HDL: 51 mg/dL (ref >50)
LDL CALC: 94 mg/dL (ref <100)
Total CHOL/HDL Ratio: 3.4 Ratio (ref <5.0)
Triglycerides: 135 mg/dL (ref <150)
VLDL: 27 mg/dL (ref <30)

## 2016-05-02 LAB — CBC WITH DIFFERENTIAL/PLATELET
Basophils Absolute: 61 cells/uL (ref 0–200)
Basophils Relative: 1 %
Eosinophils Absolute: 122 cells/uL (ref 15–500)
Eosinophils Relative: 2 %
HCT: 39.8 % (ref 35.0–45.0)
Hemoglobin: 13.8 g/dL (ref 11.7–15.5)
LYMPHS PCT: 34 %
Lymphs Abs: 2074 cells/uL (ref 850–3900)
MCH: 32.5 pg (ref 27.0–33.0)
MCHC: 34.7 g/dL (ref 32.0–36.0)
MCV: 93.9 fL (ref 80.0–100.0)
MONO ABS: 244 {cells}/uL (ref 200–950)
MPV: 10.4 fL (ref 7.5–12.5)
Monocytes Relative: 4 %
NEUTROS ABS: 3599 {cells}/uL (ref 1500–7800)
NEUTROS PCT: 59 %
Platelets: 268 10*3/uL (ref 140–400)
RBC: 4.24 MIL/uL (ref 3.80–5.10)
RDW: 13.4 % (ref 11.0–15.0)
WBC: 6.1 10*3/uL (ref 3.8–10.8)

## 2016-05-02 LAB — TSH: TSH: 2.03 m[IU]/L

## 2016-05-02 NOTE — Progress Notes (Signed)
Medical Nutrition Therapy:  Appt start time: 1400 end time:  1500.  Assessment:  Primary concerns today: she would like support for weight loss. She states she has re-joined [employer] and is having success with reported weight loss of 6 pounds in past 3 weeks. She wants to verify that she is eating and exercising correctly.    Preferred Learning Style:  No preference indicated   Learning Readiness:   Ready  Change in progress  MEDICATIONS: see list   DIETARY INTAKE:  Usual eating pattern includes 3 meals and occasional snacks per day.  Everyday foods include good variety of all food groups.  Avoided foods include fatty foods.    24-hr recall:  B ( AM): now: eggs, 1 bacon, 1/2 biscuit OR oatmeal with cinnamon and sugar, made with water, banana Snk ( AM): fresh fruit   L ( PM): brings from home; chicken breast, vegetables, 1/4 cup brown rice, occasionally extra carrots or apple OR orders based on [employer] such as Cobb salad with 1/4 packet lite [place] dressing fresh fruit Snk ( PM): fresh fruit D ( PM): black beans with cauliflower rice, grilled chicken with hot sauce, fresh fruit for dessert Snk ( PM): fresh fruit Beverages: coffee with Splenda, water,  Usual physical activity: has increased walking and including more steps to [employer], currently at 6500 steps on work days and 10,000 steps on each weekend day. She added taking the stairs to her workday activity plan recently.   Estimated energy needs: 1400 calories 158 g carbohydrates 105 g protein 39 g fat  Progress Towards Goal(s):  In progress.   Nutritional Diagnosis:  NI-1.5 Excessive energy intake As related to activity level in the past.  As evidenced by elevated BMI.      Intervention:  Nutrition counseling for weight control education initiated. Discussed Carb Counting by food group as method of portion control, reading food labels, and benefits of increased activity. Encouraged patient to continue with  [employer] as long as she feels she is benefiting from their methods and support.  Handouts given during visit include: Low Sodium options Carb Counting and Food Label handouts Meal Plan Card  Saturated vs unsaturated fats  Snack ideas  Barriers to learning/adherence to lifestyle change: none  Demonstrated degree of understanding via:  Teach Back   Monitoring/Evaluation:  Dietary intake, exercise, reading food labels, and body weight prn.

## 2016-05-02 NOTE — Patient Instructions (Addendum)
Rotator Cuff Tendinitis Rotator cuff tendinitis is inflammation of the tough, cord-like bands that connect muscle to bone (tendons) in the rotator cuff. The rotator cuff includes all of the muscles and tendons that connect the arm to the shoulder. The rotator cuff holds the head of the upper arm bone (humerus) in the cup (fossa) of the shoulder blade (scapula). This condition can lead to a long-lasting (chronic) tear. The tear may be partial or complete. What are the causes? This condition is usually caused by overusing the rotator cuff. What increases the risk? This condition is more likely to develop in athletes and workers who frequently use their shoulder or reach over their heads. This can include activities such as:  Tennis.  Baseball or softball.  Swimming.  Construction work.  Painting. What are the signs or symptoms? Symptoms of this condition include:  Pain spreading (radiating) from the shoulder to the upper arm.  Swelling and tenderness in front of the shoulder.  Pain when reaching, pulling, or lifting the arm above the head.  Pain when lowering the arm from above the head.  Minor pain in the shoulder when resting.  Increased pain in the shoulder at night.  Difficulty placing the arm behind the back. How is this diagnosed? This condition is diagnosed with a medical history and physical exam. Tests may also be done, including:  X-rays.  MRI.  Ultrasounds.  CT or MR arthrogram. During this test, a contrast material is injected and then images are taken. How is this treated? Treatment for this condition depends on the severity of the condition. In less severe cases, treatment may include:  Rest. This may be done with a sling that holds the shoulder still (immobilization). Your health care provider may also recommend avoiding activities that involve lifting your arm over your head.  Icing the shoulder.  Anti-inflammatory medicines, such as aspirin or  ibuprofen. In more severe cases, treatment may include:  Physical therapy.  Steroid injections.  Surgery. Follow these instructions at home: If you have a sling:   Wear the sling as told by your health care provider. Remove it only as told by your health care provider.  Loosen the sling if your fingers tingle, become numb, or turn cold and blue.  Keep the sling clean.  If the sling is not waterproof, do not let it get wet. Remove it, if allowed, or cover it with a watertight covering when you take a bath or shower. Managing pain, stiffness, and swelling   If directed, put ice on the injured area.  If you have a removable sling, remove it as told by your health care provider.  Put ice in a plastic bag.  Place a towel between your skin and the bag.  Leave the ice on for 20 minutes, 2-3 times a day.  Move your fingers often to avoid stiffness and to lessen swelling.  Raise (elevate) the injured area above the level of your heart while you are lying down.  Find a comfortable sleeping position or sleep on a recliner, if available. Driving   Do not drive or use heavy machinery while taking prescription pain medicine.  Ask your health care provider when it is safe to drive if you have a sling on your arm. Activity   Rest your shoulder as told by your health care provider.  Return to your normal activities as told by your health care provider. Ask your health care provider what activities are safe for you.  Do any exercises  or stretches as told by your health care provider.  If you do repetitive overhead tasks, take small breaks in between and include stretching exercises as told by your health care provider. General instructions   Do not use any products that contain nicotine or tobacco, such as cigarettes and e-cigarettes. These can delay healing. If you need help quitting, ask your health care provider.  Take over-the-counter and prescription medicines only as told by  your health care provider.  Keep all follow-up visits as told by your health care provider. This is important. Contact a health care provider if:  Your pain gets worse.  You have new pain in your arm, hands, or fingers.  Your pain is not relieved with medicine or does not get better after 6 weeks of treatment.  You have cracking sensations when moving your shoulder in certain directions.  You hear a snapping sound after using your shoulder, followed by severe pain and weakness. Get help right away if:  Your arm, hand, or fingers are numb or tingling.  Your arm, hand, or fingers are swollen or painful or they turn white or blue. Summary  Rotator cuff tendinitis is inflammation of the tough, cord-like bands that connect muscle to bone (tendons) in the rotator cuff.  This condition is usually caused by overusing the rotator cuff, which includes all of the muscles and tendons that connect the arm to the shoulder.  This condition is more likely to develop in athletes and workers who frequently use their shoulder or reach over their heads.  Treatment generally includes rest, anti-inflammatory medicines, and icing. In some cases, physical therapy and steroid injections may be needed. In severe cases, surgery may be needed. This information is not intended to replace advice given to you by your health care provider. Make sure you discuss any questions you have with your health care provider. Document Released: 04/21/2003 Document Revised: 01/16/2016 Document Reviewed: 01/16/2016 Elsevier Interactive Patient Education  2017 [place]. Shoulder Impingement Syndrome Shoulder impingement syndrome is a condition that causes pain when connective tissues (tendons) surrounding the shoulder joint become pinched. These tendons are part of the group of muscles and tissues that help to stabilize the shoulder (rotator cuff). Beneath the rotator cuff is a fluid-filled sac (bursa) that allows the  muscles and tendons to glide smoothly. The bursa may become swollen or irritated (bursitis). Bursitis, swelling in the rotator cuff tendons, or both conditions can decrease how much space is under a bone in the shoulder joint (acromion), resulting in impingement. What are the causes? Shoulder impingement syndrome can be caused by bursitis or swelling of the rotator cuff tendons, which may result from:  Repetitive overhead arm movements.  Falling onto the shoulder.  Weakness in the shoulder muscles. What increases the risk? You may be more likely to develop this condition if you are an athlete who participates in:  Sports that involve throwing, such as baseball.  Tennis.  Swimming.  Volleyball. Some people are also more likely to develop impingement syndrome because of the shape of their acromion bone. What are the signs or symptoms? The main symptom of this condition is pain on the front or side of the shoulder. Pain may:  Get worse when lifting or raising the arm.  Get worse at night.  Wake you up from sleeping.  Feel sharp when the shoulder is moved, and then fade to an ache. Other signs and symptoms may include:  Tenderness.  Stiffness.  Inability to raise the arm above shoulder  level or behind the body.  Weakness. How is this diagnosed? This condition may be diagnosed based on:  Your symptoms.  Your medical history.  A physical exam.  Imaging tests, such as:  X-rays.  MRI.  Ultrasound. How is this treated? Treatment for this condition may include:  Resting your shoulder and avoiding all activities that cause pain or put stress on the shoulder.  Icing your shoulder.  NSAIDs to help reduce pain and swelling.  One or more injections of medicines to numb the area and reduce inflammation.  Physical therapy.  Surgery. This may be needed if nonsurgical treatments have not helped. Surgery may involve repairing the rotator cuff, reshaping the acromion,  or removing the bursa. Follow these instructions at home: Managing pain, stiffness, and swelling   If directed, apply ice to the injured area.  Put ice in a plastic bag.  Place a towel between your skin and the bag.  Leave the ice on for 20 minutes, 2-3 times a day. Activity   Rest and return to your normal activities as told by your health care provider. Ask your health care provider what activities are safe for you.  Do exercises as told by your health care provider. General instructions   Do not use any tobacco products, including cigarettes, chewing tobacco, or e-cigarettes. Tobacco can delay healing. If you need help quitting, ask your health care provider.  Ask your health care provider when it is safe for you to drive.  Take over-the-counter and prescription medicines only as told by your health care provider.  Keep all follow-up visits as told by your health care provider. This is important. How is this prevented?  Give your body time to rest between periods of activity.  Be safe and responsible while being active to avoid falls.  Maintain physical fitness, including strength and flexibility. Contact a health care provider if:  Your symptoms have not improved after 1-2 months of treatment and rest.  You cannot lift your arm away from your body. This information is not intended to replace advice given to you by your health care provider. Make sure you discuss any questions you have with your health care provider. Document Released: 01/29/2005 Document Revised: 10/06/2015 Document Reviewed: 01/01/2015 Elsevier Interactive Patient Education  2017 [place] Cuff Injury Rotator cuff injury is any type of injury to the set of muscles and tendons that make up the stabilizing unit of your shoulder. This unit holds the ball of your upper arm bone (humerus) in the socket of your shoulder blade (scapula). What are the causes? Injuries to your rotator cuff most  commonly come from sports or activities that cause your arm to be moved repeatedly over your head. Examples of this include throwing, weight lifting, swimming, or racquet sports. Long lasting (chronic) irritation of your rotator cuff can cause soreness and swelling (inflammation), bursitis, and eventual damage to your tendons, such as a tear (rupture). What are the signs or symptoms? Acute rotator cuff tear:  Sudden tearing sensation followed by severe pain shooting from your upper shoulder down your arm toward your elbow.  Decreased range of motion of your shoulder because of pain and muscle spasm.  Severe pain.  Inability to raise your arm out to the side because of pain and loss of muscle power (large tears). Chronic rotator cuff tear:  Pain that usually is worse at night and may interfere with sleep.  Gradual weakness and decreased shoulder motion as the pain worsens.  Decreased range  of motion. Rotator cuff tendinitis:  Deep ache in your shoulder and the outside upper arm over your shoulder.  Pain that comes on gradually and becomes worse when lifting your arm to the side or turning it inward. How is this diagnosed? Rotator cuff injury is diagnosed through a medical history, physical exam, and imaging exam. The medical history helps determine the type of rotator cuff injury. Your health care provider will look at your injured shoulder, feel the injured area, and ask you to move your shoulder in different positions. X-ray exams typically are done to rule out other causes of shoulder pain, such as fractures. MRI is the exam of choice for the most severe shoulder injuries because the images show muscles and tendons. How is this treated? Chronic tear:  Medicine for pain, such as acetaminophen or ibuprofen.  Physical therapy and range-of-motion exercises may be helpful in maintaining shoulder function and strength.  Steroid injections into your shoulder joint.  Surgical repair of  the rotator cuff if the injury does not heal with noninvasive treatment. Acute tear:  Anti-inflammatory medicines such as ibuprofen and naproxen to help reduce pain and swelling.  A sling to help support your arm and rest your rotator cuff muscles. Long-term use of a sling is not advised. It may cause significant stiffening of the shoulder joint.  Surgery may be considered within a few weeks, especially in younger, active people, to return the shoulder to full function.  Indications for surgical treatment include the following:  Age younger than 12 years.  Rotator cuff tears that are complete.  Physical therapy, rest, and anti-inflammatory medicines have been used for 6-8 weeks, with no improvement.  Employment or sporting activity that requires constant shoulder use. Tendinitis:  Anti-inflammatory medicines such as ibuprofen and naproxen to help reduce pain and swelling.  A sling to help support your arm and rest your rotator cuff muscles. Long-term use of a sling is not advised. It may cause significant stiffening of the shoulder joint.  Severe tendinitis may require:  Steroid injections into your shoulder joint.  Physical therapy.  Surgery. Follow these instructions at home:  Apply ice to your injury:  Put ice in a plastic bag.  Place a towel between your skin and the bag.  Leave the ice on for 20 minutes, 2-3 times a day.  If you have a shoulder immobilizer (sling and straps), wear it until told otherwise by your health care provider.  You may want to sleep on several pillows or in a recliner at night to lessen swelling and pain.  Only take over-the-counter or prescription medicines for pain, discomfort, or fever as directed by your health care provider.  Do simple hand squeezing exercises with a soft rubber ball to decrease hand swelling. Contact a health care provider if:  Your shoulder pain increases, or new pain or numbness develops in your arm, hand, or  fingers.  Your hand or fingers are colder than your other hand. Get help right away if:  Your arm, hand, or fingers are numb or tingling.  Your arm, hand, or fingers are increasingly swollen and painful, or they turn white or blue. This information is not intended to replace advice given to you by your health care provider. Make sure you discuss any questions you have with your health care provider. Document Released: 01/27/2000 Document Revised: 07/07/2015 Document Reviewed: 09/10/2012 Elsevier Interactive Patient Education  2017 [place] is the time when your body begins to move into the menopause (  no menstrual period for 12 straight months). It is a natural process. Perimenopause can begin 2-8 years before the menopause and usually lasts for 1 year after the menopause. During this time, your ovaries may or may not produce an egg. The ovaries vary in their production of estrogen and progesterone hormones each month. This can cause irregular menstrual periods, difficulty getting pregnant, vaginal bleeding between periods, and uncomfortable symptoms. What are the causes?  Irregular production of the ovarian hormones, estrogen and progesterone, and not ovulating every month. Other causes include:  Tumor of the pituitary gland in the brain.  Medical disease that affects the ovaries.  Radiation treatment.  Chemotherapy.  Unknown causes.  Heavy smoking and excessive alcohol intake can bring on perimenopause sooner. What are the signs or symptoms?  Hot flashes.  Night sweats.  Irregular menstrual periods.  Decreased sex drive.  Vaginal dryness.  Headaches.  Mood swings.  Depression.  Memory problems.  Irritability.  Tiredness.  Weight gain.  Trouble getting pregnant.  The beginning of losing bone cells (osteoporosis).  The beginning of hardening of the arteries (atherosclerosis). How is this diagnosed? Your health care provider  will make a diagnosis by analyzing your age, menstrual history, and symptoms. He or she will do a physical exam and note any changes in your body, especially your female organs. Female hormone tests may or may not be helpful depending on the amount of female hormones you produce and when you produce them. However, other hormone tests may be helpful to rule out other problems. How is this treated? In some cases, no treatment is needed. The decision on whether treatment is necessary during the perimenopause should be made by you and your health care provider based on how the symptoms are affecting you and your lifestyle. Various treatments are available, such as:  Treating individual symptoms with a specific medicine for that symptom.  Herbal medicines that can help specific symptoms.  Counseling.  Group therapy. Follow these instructions at home:  Keep track of your menstrual periods (when they occur, how heavy they are, how long between periods, and how long they last) as well as your symptoms and when they started.  Only take over-the-counter or prescription medicines as directed by your health care provider.  Sleep and rest.  Exercise.  Eat a diet that contains calcium (good for your bones) and soy (acts like the estrogen hormone).  Do not smoke.  Avoid alcoholic beverages.  Take vitamin supplements as recommended by your health care provider. Taking vitamin E may help in certain cases.  Take calcium and vitamin D supplements to help prevent bone loss.  Group therapy is sometimes helpful.  Acupuncture may help in some cases. Contact a health care provider if:  You have questions about any symptoms you are having.  You need a referral to a specialist (gynecologist, psychiatrist, or psychologist). Get help right away if:  You have vaginal bleeding.  Your period lasts longer than 8 days.  Your periods are recurring sooner than 21 days.  You have bleeding after  intercourse.  You have severe depression.  You have pain when you urinate.  You have severe headaches.  You have vision problems. This information is not intended to replace advice given to you by your health care provider. Make sure you discuss any questions you have with your health care provider. Document Released: 03/08/2004 Document Revised: 07/07/2015 Document Reviewed: 08/28/2012 Elsevier Interactive Patient Education  2017 [employer].

## 2016-05-02 NOTE — Addendum Note (Signed)
Addended by: Nelva Nay on: 05/02/2016 10:52 AM   Modules accepted: Orders

## 2016-05-02 NOTE — Progress Notes (Signed)
Kristy Salas December 18, 1965 [id number]   History:    51 y.o.  for annual gyn exam with right shoulder discomfort when she raises her right arm. She reports no trauma. Last year she was referred to the neurologist because of muscle tightness and weakness and had an extensive evaluation to include MRI and all was negative for MS. Patient also has been referred in the past to the [facility] geneticist because her sister had breast cancer in her 36s and has not been tested for BRCA1 and BRCA2 gene mutation. Another sister had kidney cancer and 2 brothers with colon cancer in their 71s. Patient has had history of benign colon polyps removed in the past. Her last colonoscopy was reported to be normal in 2015. Her cancer genetic panel was reported to be negative. Her flu vaccine is up-to-date. Patient back in 2013 had endometrial ablation (her option cryoablation) she reports normal menstrual cycles and has very little in any vasomotor symptoms.  Past medical history,surgical history, family history and social history were all reviewed and documented in the EPIC chart.  Gynecologic History No LMP recorded. Patient has had an ablation. Contraception: tubal ligation Last Pap: 2016. Results were: Normal Last mammogram: Normal three-dimensional mammogram. Results were: 2016  Obstetric History OB History  Gravida Para Term Preterm AB Living  '3 3       3  ' SAB TAB Ectopic Multiple Live Births               # Outcome Date GA Lbr Len/2nd Weight Sex Delivery Anes PTL Lv  3 Para           2 Para           1 Para                ROS: A ROS was performed and pertinent positives and negatives are included in the history.  GENERAL: No fevers or chills. HEENT: No change in vision, no earache, sore throat or sinus congestion. NECK: No pain or stiffness. CARDIOVASCULAR: No chest pain or pressure. No palpitations. PULMONARY: No shortness of breath, cough or wheeze. GASTROINTESTINAL: No abdominal  pain, nausea, vomiting or diarrhea, melena or bright red blood per rectum. GENITOURINARY: No urinary frequency, urgency, hesitancy or dysuria. MUSCULOSKELETAL: No joint or muscle pain, no back pain, no recent trauma. DERMATOLOGIC: No rash, no itching, no lesions. ENDOCRINE: No polyuria, polydipsia, no heat or cold intolerance. No recent change in weight. HEMATOLOGICAL: No anemia or easy bruising or bleeding. NEUROLOGIC: No headache, seizures, numbness, tingling or weakness. PSYCHIATRIC: No depression, no loss of interest in normal activity or change in sleep pattern.     Exam: chaperone present  BP 120/76   Ht '5\' 3"'  (1.6 m)   Wt 227 lb (103 kg)   BMI 40.21 kg/m   Body mass index is 40.21 kg/m.  General appearance : Well developed well nourished female. No acute distress HEENT: Eyes: no retinal hemorrhage or exudates,  Neck supple, trachea midline, no carotid bruits, no thyroidmegaly Lungs: Clear to auscultation, no rhonchi or wheezes, or rib retractions  Heart: Regular rate and rhythm, no murmurs or gallops Breast:Examined in sitting and supine position were symmetrical in appearance, no palpable masses or tenderness,  no skin retraction, no nipple inversion, no nipple discharge, no skin discoloration, no axillary or supraclavicular lymphadenopathy Abdomen: no palpable masses or tenderness, no rebound or guarding Extremities: no edema or skin discoloration or tenderness  Pelvic:  Bartholin, Urethra, Skene  Glands: Within normal limits             Vagina: No gross lesions or discharge  Cervix: No gross lesions or discharge  Uterus  anteverted, normal size, shape and consistency, non-tender and mobile  Adnexa  Without masses or tenderness  Anus and perineum  normal   Rectovaginal  normal sphincter tone without palpated masses or tenderness             Hemoccult not indicated     Assessment/Plan:  51 y.o. female for annual exam with right appears to be rotator cuff impingement or tear  versus tendinitis. I'm going to refer her to the orthopedic stated for further evaluation. Patient with no longer any neurological complaints. Patient may be perimenopausal FSH will be checked today along with the following screening fasting blood work: Fasting lipid profile, comprehensive metabolic panel, TSH, CBC, and urinalysis. Pap smear was done today. Patient to have mammogram today. We discussed importance of calcium vitamin D and weightbearing exercises for osteoporosis prevention. Her flu vaccine is up-to-date.   Terrance Mass MD, 10:39 AM 05/02/2016

## 2016-05-03 LAB — FOLLICLE STIMULATING HORMONE: FSH: 65.9 m[IU]/mL

## 2016-05-03 LAB — PAP IG W/ RFLX HPV ASCU

## 2016-06-27 ENCOUNTER — Encounter: Payer: Self-pay | Admitting: Gynecology

## 2016-10-18 ENCOUNTER — Encounter: Payer: Self-pay | Admitting: Obstetrics & Gynecology

## 2016-10-18 ENCOUNTER — Ambulatory Visit (INDEPENDENT_AMBULATORY_CARE_PROVIDER_SITE_OTHER): Payer: 59 | Admitting: Obstetrics & Gynecology

## 2016-10-18 VITALS — BP 128/86 | Wt 207.0 lb

## 2016-10-18 DIAGNOSIS — Z113 Encounter for screening for infections with a predominantly sexual mode of transmission: Secondary | ICD-10-CM | POA: Diagnosis not present

## 2016-10-18 DIAGNOSIS — N898 Other specified noninflammatory disorders of vagina: Secondary | ICD-10-CM

## 2016-10-18 DIAGNOSIS — N9089 Other specified noninflammatory disorders of vulva and perineum: Secondary | ICD-10-CM

## 2016-10-18 LAB — WET PREP FOR TRICH, YEAST, CLUE

## 2016-10-18 NOTE — Progress Notes (Signed)
    Kristy Salas [birth year] [id number]        51 y.o.  G3P3 Married  S/P BT/S and Endometrial ablation  RP:  Vaginal discharge with vulvar irritation and itching  HPI:  C/O increased vaginal d/c and vulvar irritation and itching x about a week.  Husband had redness and irritation in genital area.  No pelvic pain.  No fever.  Would like STI screen.  Past medical history,surgical history, problem list, medications, allergies, family history and social history were all reviewed and documented in the EPIC chart.  Directed ROS with pertinent positives and negatives documented in the history of present illness/assessment and plan.  Exam:  Vitals:   10/18/16 1151  BP: 128/86  Weight: 207 lb (93.9 kg)   General appearance:  Normal  Gyn exam:  Vulva normal                     Speculum:  Increased vaginal d/c.  Wet prep done.  Cervix/Vagina normal otherwise.                     Bimanual exam:  Uterus AV, mobile, NT, normal size.  No adnexal mass, NT.  Assessment/Plan:  51 y.o. G3P3   1. Vaginal discharge Bacterial Vaginosis on Wet prep. - WET PREP FOR [place], YEAST, CLUE  2. Vulvar irritation Bacterial Vaginosis present.  Tinidazole treatment sent.  Usage discussed.  Will use Probiotic tab vaginally every week for prevention. - WET PREP FOR TRICH, YEAST, CLUE  3. Screen for STD (sexually transmitted disease) No evidence of PID. - GC/Chlamydia Probe Amp - HIV antibody - RPR - Hepatitis B Surface AntiGEN - Hepatitis C Antibody  Counseling on above issues >50% x 15 minutes.  Princess Bruins MD, 11:52 AM 10/18/2016

## 2016-10-19 ENCOUNTER — Telehealth: Payer: Self-pay | Admitting: *Deleted

## 2016-10-19 LAB — C. TRACHOMATIS/N. GONORRHOEAE RNA
C. TRACHOMATIS RNA, TMA: NOT DETECTED
N. gonorrhoeae RNA, TMA: NOT DETECTED

## 2016-10-19 LAB — HEPATITIS C ANTIBODY
HEP C AB: NONREACTIVE
SIGNAL TO CUT-OFF: 0.02 (ref <1.00)

## 2016-10-19 LAB — HEPATITIS B SURFACE ANTIGEN: Hepatitis B Surface Ag: NONREACTIVE

## 2016-10-19 LAB — RPR: RPR: NONREACTIVE

## 2016-10-19 LAB — HIV ANTIBODY (ROUTINE TESTING W REFLEX): HIV: NONREACTIVE

## 2016-10-19 MED ORDER — TINIDAZOLE 500 MG PO TABS: ORAL_TABLET | ORAL | 0 refills | Status: DC

## 2016-10-19 MED ORDER — TINIDAZOLE 500 MG PO TABS: 2.0000 g | ORAL_TABLET | Freq: Every day | ORAL | 0 refills | Status: DC

## 2016-10-19 NOTE — Patient Instructions (Signed)
1. Vaginal discharge Bacterial Vaginosis on Wet prep. - WET PREP FOR [place], YEAST, CLUE  2. Vulvar irritation Bacterial Vaginosis present.  Tinidazole treatment sent.  Usage discussed.  Will use Probiotic tab vaginally every week for prevention. - WET PREP FOR [place], YEAST, CLUE  3. Screen for STD (sexually transmitted disease) No evidence of PID. - GC/Chlamydia Probe Amp - HIV antibody - RPR - Hepatitis B Surface AntiGEN - Hepatitis C Antibody  Early Chars, it was a pleasure to meet you today!  I will inform you of your results as soon as available.   Bacterial Vaginosis Bacterial vaginosis is a vaginal infection that occurs when the normal balance of bacteria in the vagina is disrupted. It results from an overgrowth of certain bacteria. This is the most common vaginal infection among women ages 64-44. Because bacterial vaginosis increases your risk for STIs (sexually transmitted infections), getting treated can help reduce your risk for chlamydia, gonorrhea, herpes, and HIV (human immunodeficiency virus). Treatment is also important for preventing complications in pregnant women, because this condition can cause an early (premature) delivery. What are the causes? This condition is caused by an increase in harmful bacteria that are normally present in small amounts in the vagina. However, the reason that the condition develops is not fully understood. What increases the risk? The following factors may make you more likely to develop this condition:  Having a new sexual partner or multiple sexual partners.  Having unprotected sex.  Douching.  Having an intrauterine device (IUD).  Smoking.  Drug and alcohol abuse.  Taking certain antibiotic medicines.  Being pregnant.  You cannot get bacterial vaginosis from toilet seats, bedding, swimming pools, or contact with objects around you. What are the signs or symptoms? Symptoms of this condition include:  Grey or white vaginal  discharge. The discharge can also be watery or foamy.  A fish-like odor with discharge, especially after sexual intercourse or during menstruation.  Itching in and around the vagina.  Burning or pain with urination.  Some women with bacterial vaginosis have no signs or symptoms. How is this diagnosed? This condition is diagnosed based on:  Your medical history.  A physical exam of the vagina.  Testing a sample of vaginal fluid under a microscope to look for a large amount of bad bacteria or abnormal cells. Your health care provider may use a cotton swab or a small wooden spatula to collect the sample.  How is this treated? This condition is treated with antibiotics. These may be given as a pill, a vaginal cream, or a medicine that is put into the vagina (suppository). If the condition comes back after treatment, a second round of antibiotics may be needed. Follow these instructions at home: Medicines  Take over-the-counter and prescription medicines only as told by your health care provider.  Take or use your antibiotic as told by your health care provider. Do not stop taking or using the antibiotic even if you start to feel better. General instructions  If you have a female sexual partner, tell her that you have a vaginal infection. She should see her health care provider and be treated if she has symptoms. If you have a female sexual partner, he does not need treatment.  During treatment: ? Avoid sexual activity until you finish treatment. ? Do not douche. ? Avoid alcohol as directed by your health care provider. ? Avoid breastfeeding as directed by your health care provider.  Drink enough water and fluids to keep your urine clear  or pale yellow.  Keep the area around your vagina and rectum clean. ? Wash the area daily with warm water. ? Wipe yourself from front to back after using the toilet.  Keep all follow-up visits as told by your health care provider. This is  important. How is this prevented?  Do not douche.  Wash the outside of your vagina with warm water only.  Use protection when having sex. This includes latex condoms and dental dams.  Limit how many sexual partners you have. To help prevent bacterial vaginosis, it is best to have sex with just one partner (monogamous).  Make sure you and your sexual partner are tested for STIs.  Wear cotton or cotton-lined underwear.  Avoid wearing tight pants and pantyhose, especially during summer.  Limit the amount of alcohol that you drink.  Do not use any products that contain nicotine or tobacco, such as cigarettes and e-cigarettes. If you need help quitting, ask your health care provider.  Do not use illegal drugs. Where to find more information:  Centers for Disease Control and Prevention: AppraiserFraud.fi  American Sexual Health Association (ASHA): www.ashastd.org  U.S. Department of Health and Financial controller, Office on Women's Health: DustingSprays.pl or SecuritiesCard.it Contact a health care provider if:  Your symptoms do not improve, even after treatment.  You have more discharge or pain when urinating.  You have a fever.  You have pain in your abdomen.  You have pain during sex.  You have vaginal bleeding between periods. Summary  Bacterial vaginosis is a vaginal infection that occurs when the normal balance of bacteria in the vagina is disrupted.  Because bacterial vaginosis increases your risk for STIs (sexually transmitted infections), getting treated can help reduce your risk for chlamydia, gonorrhea, herpes, and HIV (human immunodeficiency virus). Treatment is also important for preventing complications in pregnant women, because the condition can cause an early (premature) delivery.  This condition is treated with antibiotic medicines. These may be given as a pill, a vaginal cream, or a medicine that is put into the  vagina (suppository). This information is not intended to replace advice given to you by your health care provider. Make sure you discuss any questions you have with your health care provider. Document Released: 01/29/2005 Document Revised: 10/15/2015 Document Reviewed: 10/15/2015 Elsevier Interactive Patient Education  2017 [employer].

## 2016-10-19 NOTE — Telephone Encounter (Signed)
Pt was seen 10/18/16 prescribed Tindamax 500 mg , pharmacy never received Rx. Rx will be resent.

## 2016-11-05 ENCOUNTER — Ambulatory Visit
Admission: RE | Admit: 2016-11-05 | Discharge: 2016-11-05 | Disposition: A | Discharge status: Home / Self Care | Payer: 59 | Source: Ambulatory Visit | Attending: Internal Medicine | Admitting: Internal Medicine

## 2016-11-05 ENCOUNTER — Other Ambulatory Visit: Payer: Self-pay | Admitting: Internal Medicine

## 2016-11-05 DIAGNOSIS — R52 Pain, unspecified: Secondary | ICD-10-CM

## 2016-11-08 ENCOUNTER — Telehealth: Payer: Self-pay | Admitting: *Deleted

## 2016-11-08 NOTE — Telephone Encounter (Signed)
Pt called requesting lab result from [employer] on 10/18/16, I called and left on voicemail all labs on this day was negative.

## 2017-05-09 ENCOUNTER — Encounter: Payer: 59 | Admitting: Gynecology

## 2017-07-09 ENCOUNTER — Other Ambulatory Visit: Payer: Self-pay

## 2017-07-09 ENCOUNTER — Other Ambulatory Visit: Payer: Self-pay | Admitting: Women's Health

## 2017-07-09 ENCOUNTER — Ambulatory Visit (INDEPENDENT_AMBULATORY_CARE_PROVIDER_SITE_OTHER): Payer: 59 | Admitting: Women's Health

## 2017-07-09 ENCOUNTER — Encounter (HOSPITAL_COMMUNITY): Payer: Self-pay

## 2017-07-09 ENCOUNTER — Ambulatory Visit (INDEPENDENT_AMBULATORY_CARE_PROVIDER_SITE_OTHER): Payer: 59

## 2017-07-09 ENCOUNTER — Emergency Department (HOSPITAL_COMMUNITY)
Admission: EM | Admit: 2017-07-09 | Discharge: 2017-07-09 | Disposition: A | Discharge status: Home / Self Care | Payer: 59 | Attending: Emergency Medicine | Admitting: Emergency Medicine

## 2017-07-09 ENCOUNTER — Encounter: Payer: Self-pay | Admitting: Women's Health

## 2017-07-09 VITALS — BP 130/80

## 2017-07-09 DIAGNOSIS — R1010 Upper abdominal pain, unspecified: Secondary | ICD-10-CM

## 2017-07-09 DIAGNOSIS — R102 Pelvic and perineal pain unspecified side: Secondary | ICD-10-CM

## 2017-07-09 DIAGNOSIS — R1030 Lower abdominal pain, unspecified: Secondary | ICD-10-CM

## 2017-07-09 DIAGNOSIS — Z5321 Procedure and treatment not carried out due to patient leaving prior to being seen by health care provider: Secondary | ICD-10-CM | POA: Insufficient documentation

## 2017-07-09 DIAGNOSIS — M549 Dorsalgia, unspecified: Secondary | ICD-10-CM | POA: Diagnosis not present

## 2017-07-09 DIAGNOSIS — R109 Unspecified abdominal pain: Secondary | ICD-10-CM | POA: Diagnosis present

## 2017-07-09 LAB — LIPASE, BLOOD: Lipase: 26 U/L (ref 11–51)

## 2017-07-09 LAB — COMPREHENSIVE METABOLIC PANEL
ALT: 21 U/L (ref 14–54)
ANION GAP: 10 (ref 5–15)
AST: 22 U/L (ref 15–41)
Albumin: 4.1 g/dL (ref 3.5–5.0)
Alkaline Phosphatase: 106 U/L (ref 38–126)
BUN: 14 mg/dL (ref 6–20)
CALCIUM: 9 mg/dL (ref 8.9–10.3)
CO2: 24 mmol/L (ref 22–32)
Chloride: 101 mmol/L (ref 101–111)
Creatinine, Ser: 0.58 mg/dL (ref 0.44–1.00)
Glucose, Bld: 134 mg/dL — ABNORMAL HIGH (ref 65–99)
POTASSIUM: 3.6 mmol/L (ref 3.5–5.1)
Sodium: 135 mmol/L (ref 135–145)
TOTAL PROTEIN: 7.3 g/dL (ref 6.5–8.1)
Total Bilirubin: 1 mg/dL (ref 0.3–1.2)

## 2017-07-09 LAB — CBC
HEMATOCRIT: 38.8 % (ref 36.0–46.0)
HEMOGLOBIN: 13 g/dL (ref 12.0–15.0)
MCH: 31.9 pg (ref 26.0–34.0)
MCHC: 33.5 g/dL (ref 30.0–36.0)
MCV: 95.3 fL (ref 78.0–100.0)
Platelets: 269 10*3/uL (ref 150–400)
RBC: 4.07 MIL/uL (ref 3.87–5.11)
RDW: 12.5 % (ref 11.5–15.5)
WBC: 7.6 10*3/uL (ref 4.0–10.5)

## 2017-07-09 NOTE — ED Triage Notes (Signed)
Patient reports that she has had 3 episodes of abdominal pain in the past 2 weeks. Patient woke today with abdominal pain that radiates into the back. Patient was sent to the ED for possible CT of the abdomen. Patient also c/o nausea and feels like a brick is laying on her abdomen.

## 2017-07-09 NOTE — Progress Notes (Signed)
52 year old MWF G3, P3 presents with upper abdominal pain that has increased over the past 2 weeks, intermittent in nature, rates today at a 9 to a 10.  Pain is  diffuse upper abdominal with some right back pain. Reports has felt somewhat lightheaded, hot flashes, without a fever.  No urinary symptoms denies vaginal discharge, bleeding, urinary symptoms, syncope or vomiting.  Postmenopausal on no HRT.  History of an endometrial ablation.  Exam: Appears in pain, was brought to the room in a wheelchair.  No CVAT, reports some relief of discomfort with light tapping on her back.  All sounds positive in all 4 quads, soft without rebound, external genitalia within normal limits, speculum exam mild atrophy, no visible discharge, wet prep negative. Ultrasound: T/V & T/A images.  Anteverted uterus intramural fibroids 15 mm.  Right ovary normal follicle 8 mm.  Left ovary follicle 7 mm.  Bilateral ovary positive CFD arterial blood flow seen.  No apparent abnormalities seen in right upper quadrant, left upper quadrant, right lower quadrant, left lower quadrant.  Negative cul-de-sac.  Endometrium 2.5 mm. Urine negative blood, leukocytes, RBCs, WBCs or bacteria.  Normal GYN ultrasound Severe upper abdominal pain  Plan: Reviewed normality of wet prep and ultrasound.  Reviewed possible kidney stone versus gallbladder or other etiology causing pain.  Instructed to call friend husband to transport to [facility], ER for further evaluation/testing.

## 2017-07-10 LAB — URINALYSIS, COMPLETE W/RFL CULTURE
BILIRUBIN URINE: NEGATIVE
Bacteria, UA: NONE SEEN /HPF
GLUCOSE, UA: NEGATIVE
HGB URINE DIPSTICK: NEGATIVE
Hyaline Cast: NONE SEEN /LPF
KETONES UR: NEGATIVE
Leukocyte Esterase: NEGATIVE
NITRITES URINE, INITIAL: NEGATIVE
PROTEIN: NEGATIVE
RBC / HPF: NONE SEEN /HPF (ref 0–2)
Specific Gravity, Urine: 1.02 (ref 1.001–1.03)
WBC UA: NONE SEEN /HPF (ref 0–5)
pH: 7 (ref 5.0–8.0)

## 2017-07-10 LAB — NO CULTURE INDICATED

## 2017-07-12 ENCOUNTER — Encounter: Payer: 59 | Admitting: Gynecology

## 2017-11-27 ENCOUNTER — Encounter: Payer: Self-pay | Admitting: Anesthesiology

## 2018-01-23 ENCOUNTER — Encounter: Payer: 59 | Admitting: Gynecology

## 2018-03-11 ENCOUNTER — Encounter: Payer: 59 | Admitting: Gynecology

## 2018-04-10 ENCOUNTER — Encounter: Payer: Self-pay | Admitting: Gynecology

## 2018-11-05 ENCOUNTER — Encounter: Payer: Self-pay | Admitting: Gynecology

## 2018-11-10 ENCOUNTER — Other Ambulatory Visit: Payer: Self-pay

## 2018-11-11 ENCOUNTER — Encounter: Payer: Self-pay | Admitting: Gynecology

## 2018-11-11 ENCOUNTER — Ambulatory Visit (INDEPENDENT_AMBULATORY_CARE_PROVIDER_SITE_OTHER): Payer: BC Managed Care – PPO | Admitting: Gynecology

## 2018-11-11 VITALS — BP 120/74 | Ht 63.0 in | Wt 232.0 lb

## 2018-11-11 DIAGNOSIS — N898 Other specified noninflammatory disorders of vagina: Secondary | ICD-10-CM | POA: Diagnosis not present

## 2018-11-11 DIAGNOSIS — Z01419 Encounter for gynecological examination (general) (routine) without abnormal findings: Secondary | ICD-10-CM

## 2018-11-11 DIAGNOSIS — M79671 Pain in right foot: Secondary | ICD-10-CM

## 2018-11-11 DIAGNOSIS — M9902 Segmental and somatic dysfunction of thoracic region: Secondary | ICD-10-CM | POA: Diagnosis not present

## 2018-11-11 DIAGNOSIS — N763 Subacute and chronic vulvitis: Secondary | ICD-10-CM

## 2018-11-11 DIAGNOSIS — M25561 Pain in right knee: Secondary | ICD-10-CM

## 2018-11-11 DIAGNOSIS — M5134 Other intervertebral disc degeneration, thoracic region: Secondary | ICD-10-CM | POA: Diagnosis not present

## 2018-11-11 DIAGNOSIS — M9901 Segmental and somatic dysfunction of cervical region: Secondary | ICD-10-CM | POA: Diagnosis not present

## 2018-11-11 DIAGNOSIS — M47814 Spondylosis without myelopathy or radiculopathy, thoracic region: Secondary | ICD-10-CM | POA: Diagnosis not present

## 2018-11-11 LAB — WET PREP FOR TRICH, YEAST, CLUE

## 2018-11-11 MED ORDER — NYSTATIN-TRIAMCINOLONE 100000-0.1 UNIT/GM-% EX CREA
1.0000 | TOPICAL_CREAM | Freq: Two times a day (BID) | CUTANEOUS | 2 refills | Status: DC
Start: 2018-11-11 — End: 2021-08-08

## 2018-11-11 MED ORDER — FLUCONAZOLE 150 MG PO TABS: 150.0000 mg | ORAL_TABLET | Freq: Once | ORAL | 0 refills | Status: AC

## 2018-11-11 NOTE — Progress Notes (Signed)
    Kristy Salas [birth year] [id number]        53 y.o.  G3P3 for annual gynecologic exam.  Former patient of Dr. Toney Rakes.  Several complaints as noted below.  Past medical history,surgical history, problem list, medications, allergies, family history and social history were all reviewed and documented as reviewed in the EPIC chart.  ROS:  Performed with pertinent positives and negatives included in the history, assessment and plan.   Additional significant findings : None   Exam: Caryn Bee assistant Vitals:   11/11/18 1623  BP: 120/74  Weight: 232 lb (105.2 kg)  Height: 5\' 3"  (1.6 m)   Body mass index is 41.1 kg/m.  General appearance:  Normal affect, orientation and appearance. Skin: Grossly normal HEENT: Without gross lesions.  No cervical or supraclavicular adenopathy. Thyroid normal.  Lungs:  Clear without wheezing, rales or rhonchi Cardiac: RR, without RMG Abdominal:  Soft, nontender, without masses, guarding, rebound, organomegaly or hernia Breasts:  Examined lying and sitting without masses, retractions, discharge or axillary adenopathy. Pelvic:  Ext, BUS, Vagina: Diffuse inflammatory type rash along both labia majora from periclitoral to perianal region.  No specific lesions or abnormal pigmentation.  Slight white discharge noted.  Cervix: Normal.  Pap smear done  Uterus: Uterus difficult to palpate but no gross masses or tenderness.  Adnexa: Without masses or tenderness    Anus and perineum: Normal   Rectovaginal: Normal sphincter tone without palpated masses or tenderness.    Assessment/Plan:  53 y.o. G3P3 female for annual gynecologic exam.   1. Peri/postmenopausal.  No bleeding.  Status post endometrial ablation in the past.  No significant menopausal symptoms.  Monitor for now and report any bleeding. 2. Vulvar irritation.  Has been going on for months.  Finds herself scratching at night while she is sleeping.  Exam shows more of a an irritative  hyperkeratotic change along both labia majora.  Slight white discharge noted.  KOH wet prep was negative.  I am going to treated as a yeast vulvitis for now with Diflucan 150 mg x 5 days and Mycolog cream twice daily.  I discussed other diagnoses to include lichen sclerosis or other inflammatory skin conditions.  If her symptoms do not resolve over the next several weeks then she will read present for skin biopsy. 3. Right foot discomfort.  Saw a chiropractor who thinks she has a bone spur.  Asked my opinion.  I recommended she see a podiatrist. 4. Intermittent right knee pain.  Started about a year ago.  Has not had this for several months.  Recommended patient follow-up with orthopedist if recurs. 5. Mammography scheduled in October.  Breast exam normal today. 6. Pap smear 04/2016.  Pap smear done today.  No history of significant abnormal Pap smears. 7. Colonoscopy 2015.  Repeat at their recommended interval. 8. DEXA never.  Will plan further into the menopause. 9. Health maintenance.  Reports routine blood work done at her primary physician's office.  Follow-up in 1 year, sooner as needed.   Anastasio Auerbach MD, 4:45 PM 11/11/2018

## 2018-11-11 NOTE — Patient Instructions (Signed)
Take the Diflucan pill daily for 5 days.  Use the prescribed cream twice daily to the irritated areas.  Follow-up in 1 month if the symptoms persist and do not seem to improve and resolve.

## 2018-11-13 LAB — PAP IG W/ RFLX HPV ASCU

## 2018-11-24 DIAGNOSIS — Z1211 Encounter for screening for malignant neoplasm of colon: Secondary | ICD-10-CM | POA: Diagnosis not present

## 2018-11-24 DIAGNOSIS — R197 Diarrhea, unspecified: Secondary | ICD-10-CM | POA: Diagnosis not present

## 2018-11-24 DIAGNOSIS — M722 Plantar fascial fibromatosis: Secondary | ICD-10-CM | POA: Diagnosis not present

## 2018-11-24 DIAGNOSIS — M7662 Achilles tendinitis, left leg: Secondary | ICD-10-CM | POA: Diagnosis not present

## 2018-11-27 ENCOUNTER — Encounter: Payer: Self-pay | Admitting: Gynecology

## 2018-11-27 DIAGNOSIS — Z803 Family history of malignant neoplasm of breast: Secondary | ICD-10-CM | POA: Diagnosis not present

## 2018-11-27 DIAGNOSIS — Z1231 Encounter for screening mammogram for malignant neoplasm of breast: Secondary | ICD-10-CM | POA: Diagnosis not present

## 2019-06-22 IMAGING — CR DG KNEE COMPLETE 4+V*L*
4 series · 4 of 4 positions shown · non-contrast
Comparison: None.

CLINICAL DATA: Left knee pain without trauma.

EXAM:
LEFT KNEE - COMPLETE 4+ VIEW

[w knee ap left]
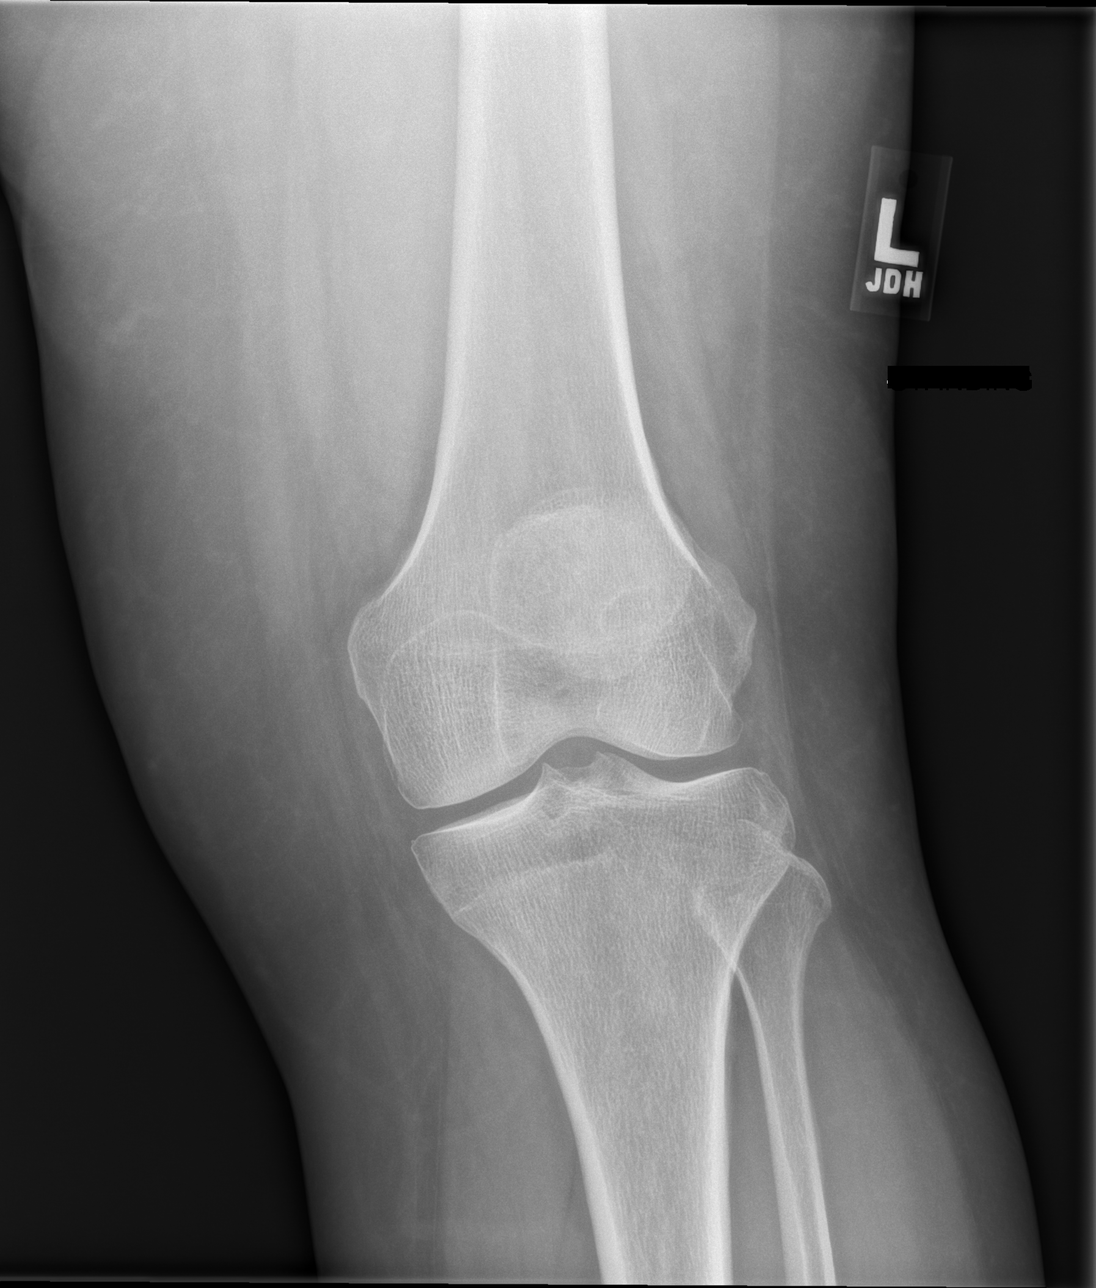

[w knee lat left]
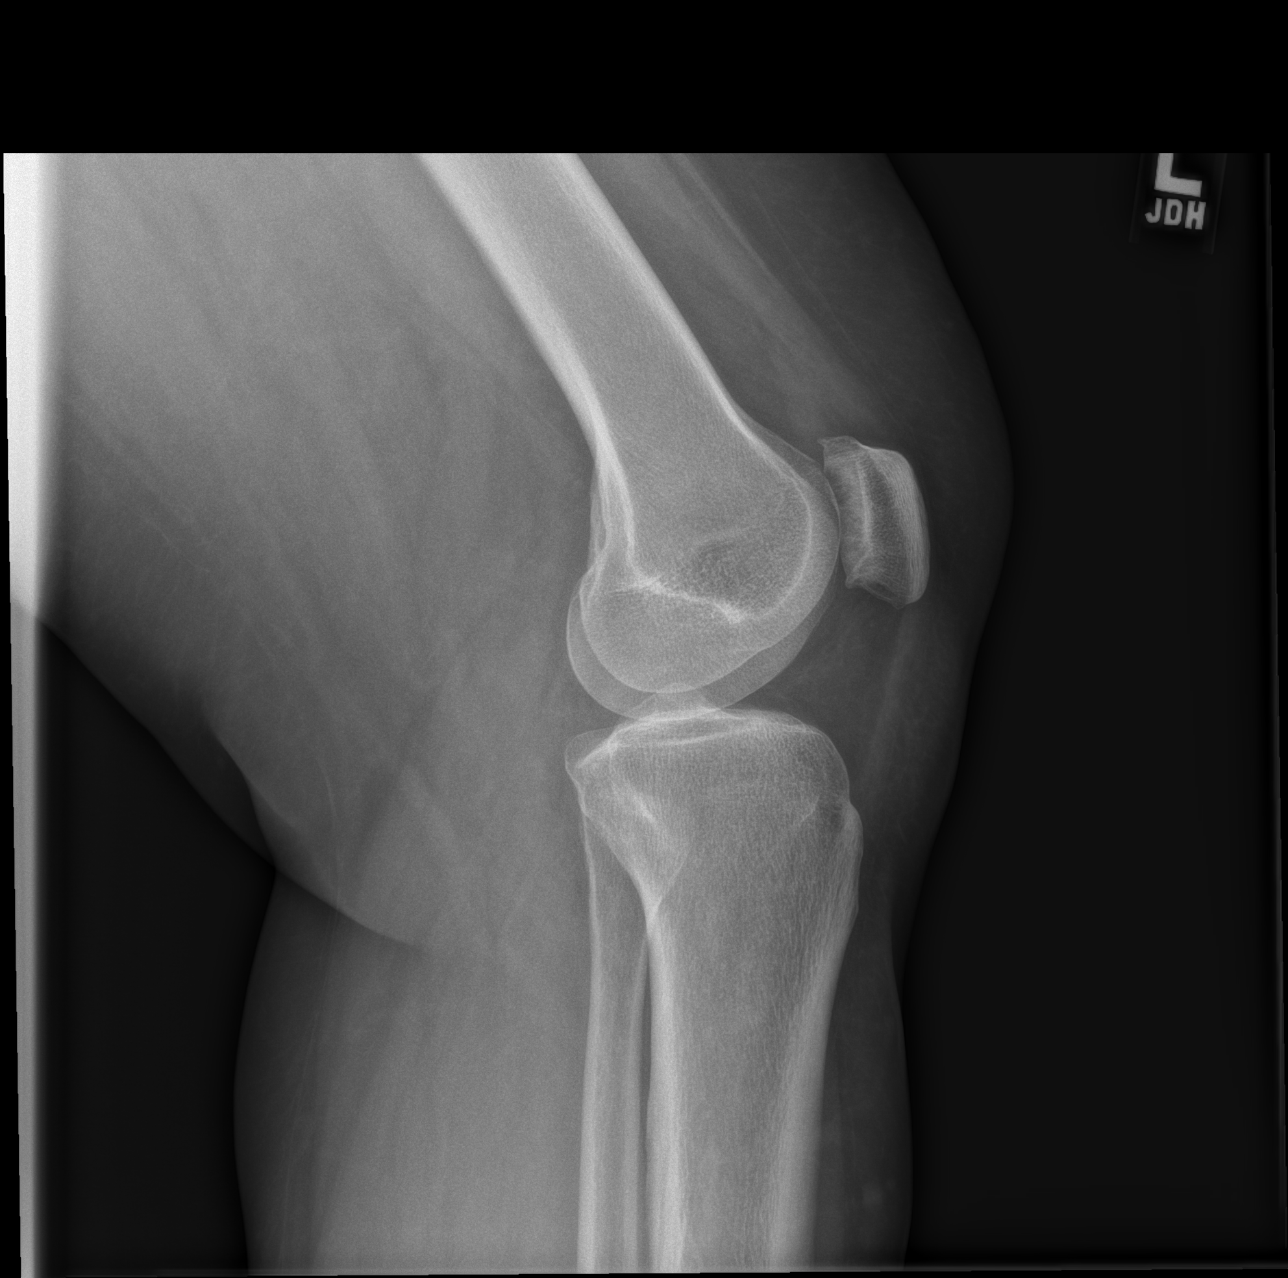

[x knee tunnel left]
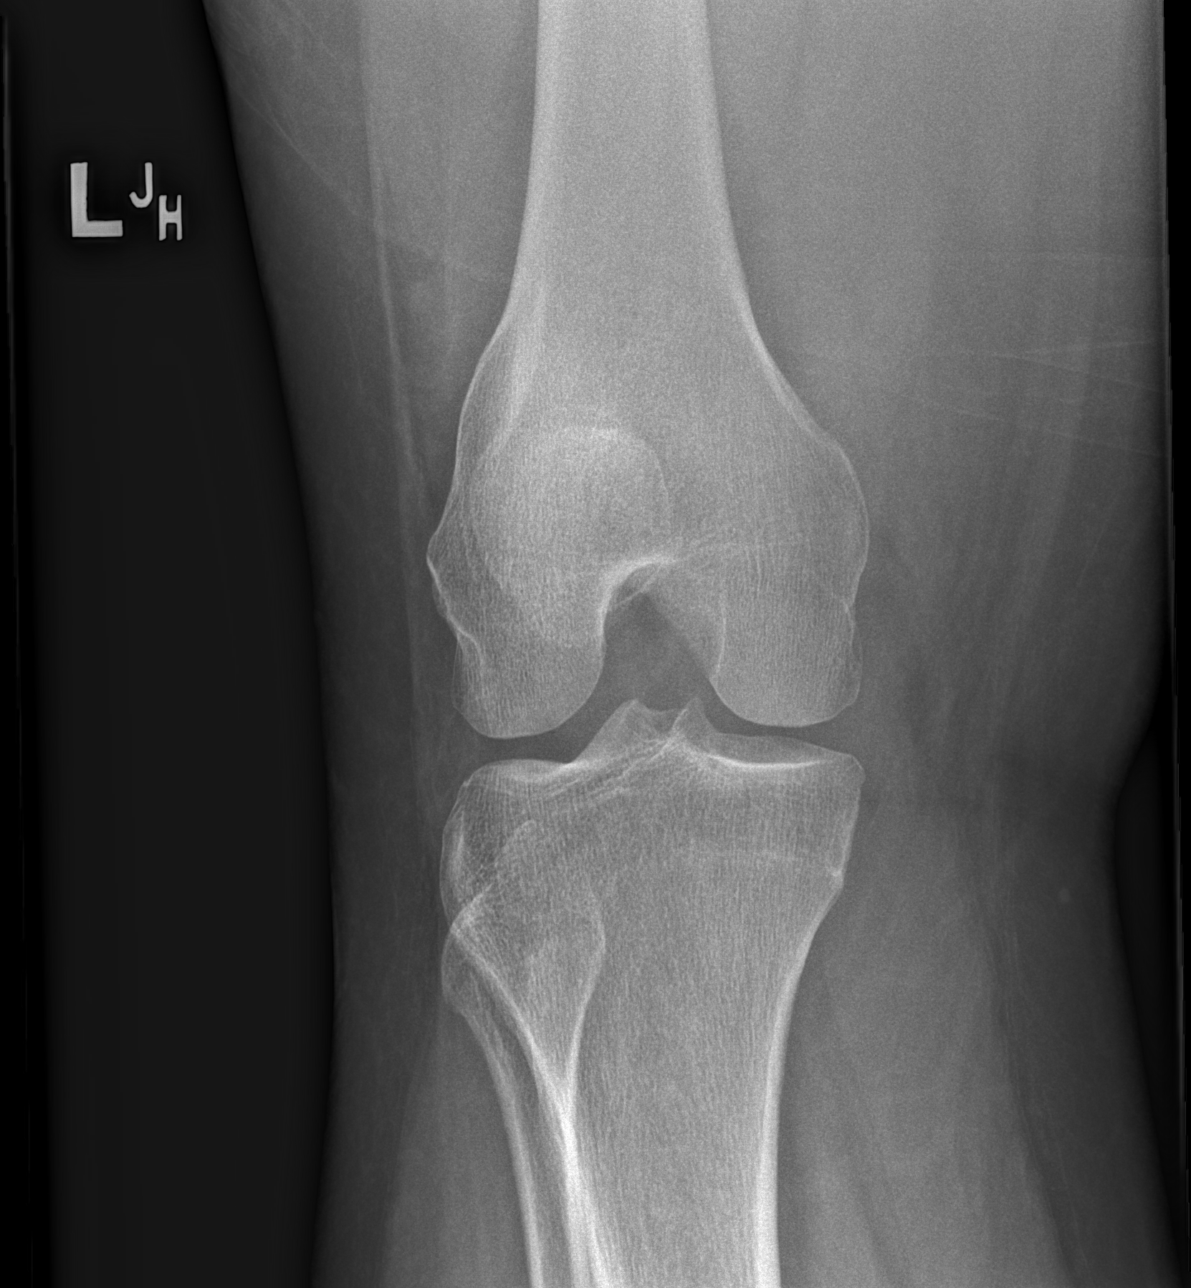

[x knee sunrise left]
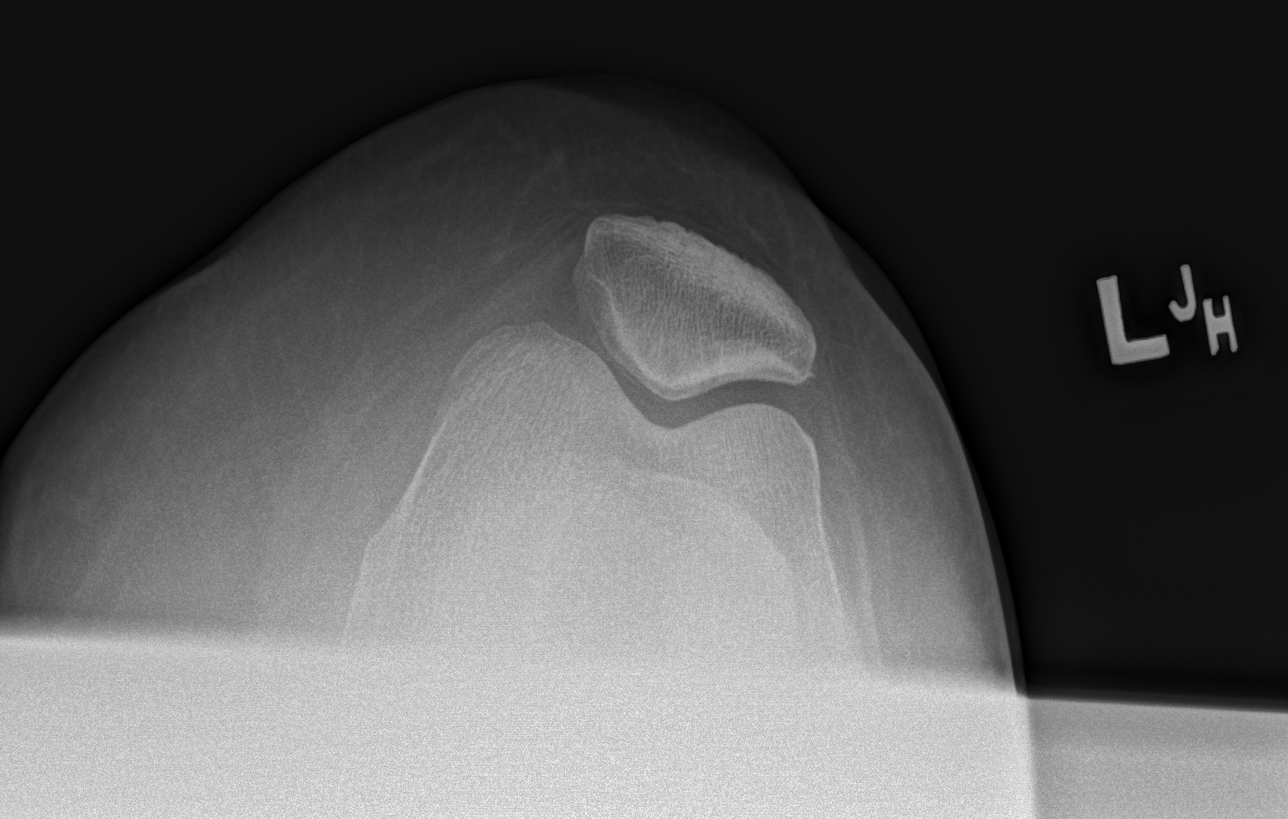

[4 of 4 positions shown; findings below may reference images not displayed]

FINDINGS: No evidence of fracture, dislocation, or joint effusion. No evidence
of arthropathy or other focal bone abnormality. Soft tissues are
unremarkable.
IMPRESSION: Negative.

## 2020-06-07 ENCOUNTER — Encounter (INDEPENDENT_AMBULATORY_CARE_PROVIDER_SITE_OTHER): Payer: Self-pay | Admitting: Otolaryngology

## 2020-06-07 ENCOUNTER — Ambulatory Visit (INDEPENDENT_AMBULATORY_CARE_PROVIDER_SITE_OTHER): Payer: BC Managed Care – PPO | Admitting: Otolaryngology

## 2020-06-07 ENCOUNTER — Other Ambulatory Visit: Payer: Self-pay

## 2020-06-07 VITALS — Temp 97.5°F

## 2020-06-07 DIAGNOSIS — Z87898 Personal history of other specified conditions: Secondary | ICD-10-CM | POA: Diagnosis not present

## 2020-06-07 NOTE — Progress Notes (Signed)
HPI: Kristy Salas is a 55 y.o. female who presents is referred by her PCP for evaluation of hearing loss.  This has been, gradual onset.  She has family members who have also noticed decline in their hearing.  She has never had a hearing test.  But her husband states that he she turns the volume up on the TV too loud and she has to ask him to repeat frequently what he says. She also states that when she talks a lot she has a tendency to strain her voice or like it is closing up.  Her voice is normal in the office today.  But she wanted her vocal cords checked.  She denies any symptoms of reflux disease.Marland Kitchen Apparently she has a history of cancer in the family.  Past Medical History:  Diagnosis Date  . Colon polyp    benign  . Colon polyps   . GDM (gestational diabetes mellitus)   . IBS (irritable bowel syndrome)   . Menorrhagia   . NSVD (normal spontaneous vaginal delivery)    times 3  . Overweight(278.02)    Past Surgical History:  Procedure Laterality Date  . ENDOMETRIAL ABLATION    . FINGER SURGERY    . TUBAL LIGATION     Social History   Socioeconomic History  . Marital status: Married    Spouse name: Not on file  . Number of children: 3  . Years of education: Not on file  . Highest education level: Not on file  Occupational History  . Not on file  Tobacco Use  . Smoking status: Former Smoker    Quit date: 04/09/1986    Years since quitting: 34.1  . Smokeless tobacco: Never Used  Vaping Use  . Vaping Use: Never used  Substance and Sexual Activity  . Alcohol use: Yes    Alcohol/week: 0.0 standard drinks    Comment: RARELY- SOCIALLY ONLY  . Drug use: No  . Sexual activity: Yes    Birth control/protection: Surgical    Comment: TUBAL LIGATION-1st intercourse 55 yo-Fewer than 5 partners  Other Topics Concern  . Not on file  Social History Narrative  . Not on file   Social Determinants of Health   Financial Resource Strain: Not on file  Food Insecurity: Not on file   Transportation Needs: Not on file  Physical Activity: Not on file  Stress: Not on file  Social Connections: Not on file   Family History  Problem Relation Age of Onset  . Cirrhosis Mother        hep c  . Heart disease Father   . Breast cancer Sister        dx in her 63s  . Colon cancer Brother 17  . Breast cancer Maternal Aunt        dx in her 50s  . Colon cancer Maternal Uncle        dx in his 31s  . Kidney cancer Sister 1  . Rheum arthritis Sister   . Colon cancer Brother 68  . Cancer Maternal Aunt        cervical vs. ovarian   No Known Allergies Prior to Admission medications   Medication Sig Start Date End Date Taking? Authorizing Provider  Ascorbic Acid (VITAMIN C) 100 MG tablet Take 100 mg by mouth daily.    [provider]  cholecalciferol (VITAMIN D) 1000 units tablet Take 1,000 Units by mouth daily.    [provider]  GLUCOSAMINE-CHONDROITIN PO Take 1  tablet by mouth daily.    [provider]  Multiple Vitamins-Minerals (MULTIVITAMIN PO) Take by mouth. Reported on 06/01/2015    [provider]  nystatin-triamcinolone (MYCOLOG II) cream Apply 1 application topically 2 (two) times daily. 11/11/18   Fontaine, Belinda Block, MD  vitamin B-12 (CYANOCOBALAMIN) 100 MCG tablet Take 100 mcg by mouth daily.    [provider]     Positive ROS: Otherwise negative  All other systems have been reviewed and were otherwise negative with the exception of those mentioned in the HPI and as above.  Physical Exam: Constitutional: Alert, well-appearing, no acute distress Ears: External ears without lesions or tenderness. Ear canals are clear bilaterally.  Minimal wax on the left side that was cleaned with a suction.  TMs were clear bilaterally. Nasal: External nose without lesions. Septum with minimal deformity and mild rhinitis.. Clear nasal passages otherwise. Oral: Lips and gums without lesions. Tongue and palate mucosa without lesions.  Posterior oropharynx clear. Fiberoptic laryngoscopy was performed through the right nostril.  The nasopharynx was clear.  The base of tongue vallecula and epiglottis were normal.  Vocal cords were clear bilaterally with normal vocal mobility.  No vocal cord lesions noted.  Piriform sinuses were clear. Neck: No palpable adenopathy or masses Respiratory: Breathing comfortably  Skin: No facial/neck lesions or rash noted.  Audiologic testing demonstrated essentially normal hearing in both ears with perhaps a very mild high-frequency sensorineural hearing loss in the left ear above 6000 frequency.  SRT's were 5 DB on the right and 10 DB on the left.  She had type A tympanograms bilaterally.  Laryngoscopy  Date/Time: 06/07/2020 3:33 PM Performed by: Rozetta Nunnery, MD Authorized by: Rozetta Nunnery, MD   Consent:    Consent obtained:  Verbal   Consent given by:  Patient Procedure details:    Indications: direct visualization of the upper aerodigestive tract     Medication:  Afrin   Instrument: flexible fiberoptic laryngoscope     Scope location: right nare   Sinus:    Right nasopharynx: normal   Mouth:    Oropharynx: normal     Vallecula: normal     Base of tongue: normal     Epiglottis: normal   Throat:    Pyriform sinus: normal     True vocal cords: normal   Comments:     On fiberoptic laryngoscopy vocal cords were clear bilaterally.    Assessment: Normal hearing evaluation on audiologic testing except for a very mild high-frequency SNHL in the left ear only. Normal vocal cord examination.  Plan: Reviewed the hearing test with the patient in the office today.  I discussed with her that she has essentially normal hearing in both ears. Also discussed with her concerning normal vocal cord examination on fiberoptic laryngoscopy.  If she has voice problems after doing a lot of talking might benefit by seeing speech therapy if needed.  But no significant vocal cord  pathology noted on exam.  She will follow-up as needed.   Radene Journey, MD   CC:

## 2020-06-14 ENCOUNTER — Encounter (INDEPENDENT_AMBULATORY_CARE_PROVIDER_SITE_OTHER): Payer: Self-pay

## 2021-02-23 ENCOUNTER — Other Ambulatory Visit: Payer: Self-pay | Admitting: Internal Medicine

## 2021-02-23 MED ORDER — NIRMATRELVIR/RITONAVIR (PAXLOVID)TABLET: 3.0000 | ORAL_TABLET | Freq: Two times a day (BID) | ORAL | 0 refills | Status: DC

## 2021-02-24 ENCOUNTER — Other Ambulatory Visit: Payer: Self-pay

## 2021-02-24 MED ORDER — NIRMATRELVIR/RITONAVIR (PAXLOVID)TABLET: 3.0000 | ORAL_TABLET | Freq: Two times a day (BID) | ORAL | 0 refills | Status: AC

## 2021-06-20 ENCOUNTER — Ambulatory Visit: Payer: BC Managed Care – PPO | Admitting: Obstetrics & Gynecology

## 2021-08-08 ENCOUNTER — Encounter: Payer: Self-pay | Admitting: Obstetrics & Gynecology

## 2021-08-08 ENCOUNTER — Ambulatory Visit (INDEPENDENT_AMBULATORY_CARE_PROVIDER_SITE_OTHER): Payer: BC Managed Care – PPO | Admitting: Obstetrics & Gynecology

## 2021-08-08 ENCOUNTER — Other Ambulatory Visit (HOSPITAL_COMMUNITY)
Admission: RE | Admit: 2021-08-08 | Discharge: 2021-08-08 | Disposition: A | Discharge status: Home / Self Care | Payer: BC Managed Care – PPO | Source: Ambulatory Visit | Attending: Obstetrics & Gynecology | Admitting: Obstetrics & Gynecology

## 2021-08-08 VITALS — BP 102/64 | HR 93 | Ht 62.25 in | Wt 219.0 lb

## 2021-08-08 DIAGNOSIS — N898 Other specified noninflammatory disorders of vagina: Secondary | ICD-10-CM | POA: Diagnosis not present

## 2021-08-08 DIAGNOSIS — Z01419 Encounter for gynecological examination (general) (routine) without abnormal findings: Secondary | ICD-10-CM | POA: Diagnosis not present

## 2021-08-08 DIAGNOSIS — Z780 Asymptomatic menopausal state: Secondary | ICD-10-CM

## 2021-08-08 DIAGNOSIS — E6609 Other obesity due to excess calories: Secondary | ICD-10-CM | POA: Diagnosis not present

## 2021-08-08 DIAGNOSIS — E66812 Obesity, class 2: Secondary | ICD-10-CM

## 2021-08-08 DIAGNOSIS — Z6839 Body mass index (BMI) 39.0-39.9, adult: Secondary | ICD-10-CM

## 2021-08-08 LAB — WET PREP FOR TRICH, YEAST, CLUE

## 2021-08-08 MED ORDER — PHENTERMINE HCL 37.5 MG PO TABS: 37.5000 mg | ORAL_TABLET | Freq: Every day | ORAL | 2 refills | Status: DC

## 2021-08-08 NOTE — Progress Notes (Signed)
Kristy Salas 1965-10-31 [id number]   History:    56 y.o. G3P3L3  RP:  Established patient presenting for annual gyn exam   HPI: Postmenopausal.  No PMB.  Status post endometrial ablation in the past.  Mild hot flushes and night sweats controled on Black Cohosh.  No pelvic pain. No vaginal discharge. Mild intermittent vulvar itching.  Pap 10/2018 Neg. No history of significant abnormal Pap smears. Pap reflex today.  Breasts normal.  Mammography Neg annually. Colono 02/2019.  BMI 39.73.  Previously successful with Weight Watcher, but not finding a program in person currently.  Recommend Low Calorie/Carb diet with intermittent fasting and regular fitness activities. Health labs with Fam MD.   Past medical history,surgical history, family history and social history were all reviewed and documented in the EPIC chart.  Gynecologic History No LMP recorded. Patient has had an ablation.  Obstetric History OB History  Gravida Para Term Preterm AB Living  '3 3       3  '$ SAB IAB Ectopic Multiple Live Births               # Outcome Date GA Lbr Len/2nd Weight Sex Delivery Anes PTL Lv  3 Para           2 Para           1 Para              ROS: A ROS was performed and pertinent positives and negatives are included in the history. GENERAL: No fevers or chills. HEENT: No change in vision, no earache, sore throat or sinus congestion. NECK: No pain or stiffness. CARDIOVASCULAR: No chest pain or pressure. No palpitations. PULMONARY: No shortness of breath, cough or wheeze. GASTROINTESTINAL: No abdominal pain, nausea, vomiting or diarrhea, melena or bright red blood per rectum. GENITOURINARY: No urinary frequency, urgency, hesitancy or dysuria. MUSCULOSKELETAL: No joint or muscle pain, no back pain, no recent trauma. DERMATOLOGIC: No rash, no itching, no lesions. ENDOCRINE: No polyuria, polydipsia, no heat or cold intolerance. No recent change in weight. HEMATOLOGICAL: No anemia or easy bruising or  bleeding. NEUROLOGIC: No headache, seizures, numbness, tingling or weakness. PSYCHIATRIC: No depression, no loss of interest in normal activity or change in sleep pattern.     Exam:   BP 102/64   Pulse 93   Ht 5' 2.25" (1.581 m)   Wt 219 lb (99.3 kg)   SpO2 98%   BMI 39.73 kg/m   Body mass index is 39.73 kg/m.  General appearance : Well developed well nourished female. No acute distress HEENT: Eyes: no retinal hemorrhage or exudates,  Neck supple, trachea midline, no carotid bruits, no thyroidmegaly Lungs: Clear to auscultation, no rhonchi or wheezes, or rib retractions  Heart: Regular rate and rhythm, no murmurs or gallops Breast:Examined in sitting and supine position were symmetrical in appearance, no palpable masses or tenderness,  no skin retraction, no nipple inversion, no nipple discharge, no skin discoloration, no axillary or supraclavicular lymphadenopathy Abdomen: no palpable masses or tenderness, no rebound or guarding Extremities: no edema or skin discoloration or tenderness  Pelvic: Vulva: Mild irritation.             Vagina: No gross lesions or discharge.  Wet prep done.  Cervix: No gross lesions or discharge.  Pap reflex done.  Uterus  AV, normal size, shape and consistency, non-tender and mobile  Adnexa  Without masses or tenderness  Anus: Normal  Wet prep:  Negative   Assessment/Plan:  56 y.o. female for annual exam   1. Encounter for routine gynecological examination with Papanicolaou smear of cervix Postmenopausal.  No PMB.  Status post endometrial ablation in the past.  Mild hot flushes and night sweats controled on Black Cohosh.  No pelvic pain. No vaginal discharge. Mild intermittent vulvar itching.  Pap 10/2018 Neg. No history of significant abnormal Pap smears. Pap reflex today.  Breasts normal.  Mammography Neg annually. Colono 02/2019.  BMI 39.73.  Previously successful with Weight Watcher, but not finding a program in person currently.  Recommend Low  Calorie/Carb diet with intermittent fasting and regular fitness activities. Health labs with Fam MD. - Cytology - PAP( [facility])  2. Postmenopause Postmenopausal.  No PMB.  Status post endometrial ablation in the past.  Mild hot flushes and night sweats controled on Black Cohosh.   3. Vular itching Wet prep Negative.  Will try Hydrocortisone 1% ointment as needed.  If not controlling her symptoms, may try an Antifungal cream. - WET PREP FOR TRICH, YEAST, CLUE  4. Class 2 obesity due to excess calories without serious comorbidity with body mass index (BMI) of 39.0 to 39.9 in adult  BMI 39.73.  Previously successful with Weight Watcher, but not finding a program in person currently.  Recommend Low Calorie/Carb diet with intermittent fasting and regular fitness activities. Medications to improve her chances of success to loose weight discussed.  Decision to start on Phentermine.  No CI.  Usage reviewed and prescription sent to pharmacy.  Other orders - phentermine (ADIPEX-P) 37.5 MG tablet; Take 1 tablet (37.5 mg total) by mouth daily before breakfast.   Counseling and management of vulvar itching and weight loss with review of documentation for 15 minutes.  Princess Bruins MD, 4:29 PM 08/08/2021

## 2021-08-09 ENCOUNTER — Encounter: Payer: Self-pay | Admitting: Obstetrics & Gynecology

## 2021-08-10 LAB — CYTOLOGY - PAP: Diagnosis: NEGATIVE

## 2022-01-19 ENCOUNTER — Encounter: Payer: Self-pay | Admitting: Obstetrics & Gynecology

## 2022-03-23 ENCOUNTER — Other Ambulatory Visit (HOSPITAL_COMMUNITY): Payer: Self-pay

## 2022-03-23 MED ORDER — WEGOVY 1 MG/0.5ML ~~LOC~~ SOAJ
1.0000 mg | SUBCUTANEOUS | 0 refills | Status: DC
  Filled 2022-03-23: qty 2, 28d supply, fill #0

## 2022-03-27 ENCOUNTER — Other Ambulatory Visit (HOSPITAL_COMMUNITY): Payer: Self-pay

## 2022-04-23 ENCOUNTER — Other Ambulatory Visit (HOSPITAL_COMMUNITY): Payer: Self-pay

## 2022-04-23 MED ORDER — SEMAGLUTIDE-WEIGHT MANAGEMENT 1 MG/0.5ML ~~LOC~~ SOAJ
1.0000 mg | SUBCUTANEOUS | 2 refills | Status: AC
  Filled 2022-04-23: qty 2, 28d supply, fill #0

## 2022-04-24 ENCOUNTER — Other Ambulatory Visit (HOSPITAL_COMMUNITY): Payer: Self-pay

## 2022-04-24 MED ORDER — WEGOVY 1.7 MG/0.75ML ~~LOC~~ SOAJ
1.7000 mg | SUBCUTANEOUS | 0 refills | Status: DC
  Filled 2022-04-24: qty 3, 28d supply, fill #0

## 2022-04-25 ENCOUNTER — Other Ambulatory Visit (HOSPITAL_COMMUNITY): Payer: Self-pay

## 2022-05-21 ENCOUNTER — Other Ambulatory Visit (HOSPITAL_COMMUNITY): Payer: Self-pay

## 2022-05-21 MED ORDER — SEMAGLUTIDE-WEIGHT MANAGEMENT 1.7 MG/0.75ML ~~LOC~~ SOAJ
1.7000 mg | SUBCUTANEOUS | 1 refills | Status: AC
  Filled 2022-05-21 – 2022-05-23 (×2): qty 3, 28d supply, fill #0
  Filled 2022-06-16: qty 3, 28d supply, fill #1

## 2022-05-22 ENCOUNTER — Other Ambulatory Visit (HOSPITAL_COMMUNITY): Payer: Self-pay

## 2022-05-23 ENCOUNTER — Other Ambulatory Visit (HOSPITAL_COMMUNITY): Payer: Self-pay

## 2022-05-23 ENCOUNTER — Other Ambulatory Visit (HOSPITAL_BASED_OUTPATIENT_CLINIC_OR_DEPARTMENT_OTHER): Payer: Self-pay

## 2022-05-24 ENCOUNTER — Other Ambulatory Visit (HOSPITAL_COMMUNITY): Payer: Self-pay

## 2022-06-16 ENCOUNTER — Other Ambulatory Visit (HOSPITAL_COMMUNITY): Payer: Self-pay

## 2022-06-18 ENCOUNTER — Other Ambulatory Visit: Payer: Self-pay

## 2022-06-18 ENCOUNTER — Other Ambulatory Visit (HOSPITAL_COMMUNITY): Payer: Self-pay

## 2022-06-19 ENCOUNTER — Other Ambulatory Visit (HOSPITAL_COMMUNITY): Payer: Self-pay

## 2022-06-19 MED ORDER — SEMAGLUTIDE-WEIGHT MANAGEMENT 1.7 MG/0.75ML ~~LOC~~ SOAJ
1.7000 mg | SUBCUTANEOUS | 2 refills | Status: AC
  Filled 2022-06-19: qty 3, 28d supply, fill #0

## 2022-06-20 ENCOUNTER — Other Ambulatory Visit (HOSPITAL_COMMUNITY): Payer: Self-pay

## 2022-06-20 MED ORDER — WEGOVY 2.4 MG/0.75ML ~~LOC~~ SOAJ
2.4000 mg | SUBCUTANEOUS | 1 refills | Status: DC
  Filled 2022-06-20: qty 3, 28d supply, fill #0
  Filled 2022-07-20 – 2022-07-26 (×3): qty 3, 28d supply, fill #1

## 2022-06-21 ENCOUNTER — Other Ambulatory Visit (HOSPITAL_COMMUNITY): Payer: Self-pay

## 2022-07-26 ENCOUNTER — Other Ambulatory Visit (HOSPITAL_COMMUNITY): Payer: Self-pay

## 2022-07-31 ENCOUNTER — Other Ambulatory Visit (HOSPITAL_COMMUNITY): Payer: Self-pay

## 2022-08-22 ENCOUNTER — Other Ambulatory Visit (HOSPITAL_COMMUNITY): Payer: Self-pay

## 2022-08-23 ENCOUNTER — Other Ambulatory Visit (HOSPITAL_COMMUNITY): Payer: Self-pay

## 2022-08-23 MED ORDER — SEMAGLUTIDE-WEIGHT MANAGEMENT 2.4 MG/0.75ML ~~LOC~~ SOAJ
2.4000 mg | SUBCUTANEOUS | 3 refills | Status: DC
  Filled 2022-08-23: qty 3, 28d supply, fill #0
  Filled 2022-09-21: qty 3, 28d supply, fill #1
  Filled 2022-10-20: qty 3, 28d supply, fill #2
  Filled 2022-11-19: qty 3, 28d supply, fill #3

## 2022-09-21 ENCOUNTER — Other Ambulatory Visit: Payer: Self-pay

## 2022-09-24 ENCOUNTER — Other Ambulatory Visit (HOSPITAL_COMMUNITY): Payer: Self-pay

## 2022-09-24 MED ORDER — ZEPBOUND 10 MG/0.5ML ~~LOC~~ SOAJ
10.0000 mg | SUBCUTANEOUS | 0 refills | Status: DC
  Filled 2022-09-24: qty 2, 28d supply, fill #0

## 2022-11-19 ENCOUNTER — Other Ambulatory Visit (HOSPITAL_COMMUNITY): Payer: Self-pay

## 2022-11-20 ENCOUNTER — Other Ambulatory Visit: Payer: Self-pay

## 2022-11-20 ENCOUNTER — Other Ambulatory Visit (HOSPITAL_COMMUNITY): Payer: Self-pay

## 2022-11-20 MED ORDER — TIRZEPATIDE-WEIGHT MANAGEMENT 10 MG/0.5ML ~~LOC~~ SOAJ
10.0000 mg | SUBCUTANEOUS | 0 refills | Status: DC
  Filled 2022-11-20 – 2022-11-29 (×3): qty 2, 28d supply, fill #0

## 2022-11-21 ENCOUNTER — Other Ambulatory Visit: Payer: Self-pay

## 2022-11-26 ENCOUNTER — Other Ambulatory Visit: Payer: Self-pay

## 2022-11-29 ENCOUNTER — Other Ambulatory Visit (HOSPITAL_COMMUNITY): Payer: Self-pay

## 2022-12-02 ENCOUNTER — Other Ambulatory Visit (HOSPITAL_COMMUNITY): Payer: Self-pay

## 2022-12-02 MED ORDER — VITAMIN D (ERGOCALCIFEROL) 50000 UNITS PO CAPS
1.0000 | ORAL_CAPSULE | ORAL | 1 refills | Status: DC
  Filled 2022-12-02 – 2023-02-26 (×4): qty 12, 84d supply, fill #0
  Filled 2023-05-27 – 2023-08-14 (×5): qty 12, 84d supply, fill #1

## 2022-12-03 ENCOUNTER — Other Ambulatory Visit (HOSPITAL_COMMUNITY): Payer: Self-pay

## 2022-12-13 ENCOUNTER — Other Ambulatory Visit (HOSPITAL_COMMUNITY): Payer: Self-pay

## 2022-12-31 ENCOUNTER — Other Ambulatory Visit (HOSPITAL_COMMUNITY): Payer: Self-pay

## 2022-12-31 MED ORDER — ZEPBOUND 12.5 MG/0.5ML ~~LOC~~ SOAJ
12.5000 mg | SUBCUTANEOUS | 0 refills | Status: DC
  Filled 2022-12-31: qty 2, 28d supply, fill #0

## 2023-01-02 ENCOUNTER — Other Ambulatory Visit (HOSPITAL_COMMUNITY): Payer: Self-pay

## 2023-01-02 MED ORDER — COVID-19 MRNA VAC-TRIS(PFIZER) 30 MCG/0.3ML IM SUSY
0.3000 mL | PREFILLED_SYRINGE | Freq: Once | INTRAMUSCULAR | 0 refills | Status: AC
  Filled 2023-01-02: qty 0.3, 1d supply, fill #0

## 2023-01-04 ENCOUNTER — Other Ambulatory Visit (HOSPITAL_COMMUNITY): Payer: Self-pay

## 2023-01-08 ENCOUNTER — Encounter: Payer: Self-pay | Admitting: Obstetrics and Gynecology

## 2023-01-08 ENCOUNTER — Ambulatory Visit (INDEPENDENT_AMBULATORY_CARE_PROVIDER_SITE_OTHER): Payer: No Typology Code available for payment source | Admitting: Obstetrics and Gynecology

## 2023-01-08 ENCOUNTER — Other Ambulatory Visit (HOSPITAL_COMMUNITY): Payer: Self-pay

## 2023-01-08 VITALS — BP 108/80 | HR 84 | Ht 62.5 in | Wt 193.0 lb

## 2023-01-08 DIAGNOSIS — N898 Other specified noninflammatory disorders of vagina: Secondary | ICD-10-CM

## 2023-01-08 DIAGNOSIS — Z01419 Encounter for gynecological examination (general) (routine) without abnormal findings: Secondary | ICD-10-CM

## 2023-01-08 DIAGNOSIS — N9089 Other specified noninflammatory disorders of vulva and perineum: Secondary | ICD-10-CM | POA: Diagnosis not present

## 2023-01-08 DIAGNOSIS — L280 Lichen simplex chronicus: Secondary | ICD-10-CM

## 2023-01-08 DIAGNOSIS — R6882 Decreased libido: Secondary | ICD-10-CM | POA: Insufficient documentation

## 2023-01-08 LAB — WET PREP FOR TRICH, YEAST, CLUE

## 2023-01-08 MED ORDER — CLOBETASOL PROPIONATE 0.05 % EX OINT: 1.0000 | TOPICAL_OINTMENT | Freq: Every day | CUTANEOUS | 0 refills | Status: AC

## 2023-01-08 NOTE — Assessment & Plan Note (Addendum)
Also discussed multifactorial etiologies for low libido. Could consider testosterone after menopause, however only has a modest effect.  Encouraged healthy lifestyle and intentionality around physical intimacy.  Reviewed that supplements are not FDA regulated. RTO for further discussion. Pamphlet for HRT provided.

## 2023-01-08 NOTE — Progress Notes (Signed)
57 y.o. G3P3 female here for annual exam. Married.  Patient with multiple complaints today. Tearful about decreased libido with husband.  Taking mental vitamins and has not noticed a difference. Pt c/o vaginal itching and vaginal odor.  Has had chronic itching for years.  Symptoms are worse at night.  And she wakes up with bleeding from scratching.  She is using Dove for soap. Also notes brain fog.  No LMP recorded. Patient has had an ablation.   Abnormal bleeding: no Pelvic discharge or pain: none Breast mass, nipple discharge or skin changes : none Last PAP:     Component Value Date/Time   DIAGPAP  08/08/2021 1652    - Negative for intraepithelial lesion or malignancy (NILM)   ADEQPAP  08/08/2021 1652    Satisfactory for evaluation; transformation zone component PRESENT.   Last mammogram: 01/17/22 BIRADS 1, density b Last colonoscopy: 02/2019 Sexually active: yes, however see above  Exercising: no  GYN HISTORY: Endometrial ablation  OB History  Gravida Para Term Preterm AB Living  3 3       3   SAB IAB Ectopic Multiple Live Births               # Outcome Date GA Lbr Len/2nd Weight Sex Type Anes PTL Lv  3 Para           2 Para           1 Para             Past Medical History:  Diagnosis Date   Colon polyp    benign   Colon polyps    GDM (gestational diabetes mellitus)    IBS (irritable bowel syndrome)    Menorrhagia    NSVD (normal spontaneous vaginal delivery)    times 3   Overweight(278.02)     Past Surgical History:  Procedure Laterality Date   CHOLECYSTECTOMY     ENDOMETRIAL ABLATION     FINGER SURGERY     TUBAL LIGATION      Current Outpatient Medications on File Prior to Visit  Medication Sig Dispense Refill   Ascorbic Acid (VITAMIN C) 100 MG tablet Take 100 mg by mouth daily.     BIOTIN PO Take by mouth.     BLACK COHOSH PO Take 540 mg by mouth.     GLUCOSAMINE-CHONDROITIN PO Take 1 tablet by mouth daily.     KRILL OIL PO Take by mouth.      Multiple Vitamins-Minerals (MULTI-VITAMIN MENOPAUSAL PO) Take by mouth.     Multiple Vitamins-Minerals (MULTIVITAMIN PO) Take by mouth. Reported on 06/01/2015     Semaglutide-Weight Management (WEGOVY) 1 MG/0.5ML SOAJ Inject 1 mg into the skin once a week on the same day each week. 2 mL 2   Semaglutide-Weight Management (WEGOVY) 1.7 MG/0.75ML SOAJ Inject 1.7 mg into the skin once a week on the same day of each week 3 mL 1   Semaglutide-Weight Management (WEGOVY) 2.4 MG/0.75ML SOAJ Inject 2.4 mg into the skin once a week on the same day of each week 3 mL 3   Semaglutide-Weight Management 1.7 MG/0.75ML SOAJ Inject 1.7 mg into the skin on the same day each week. 3 mL 2   vitamin B-12 (CYANOCOBALAMIN) 100 MCG tablet Take 100 mcg by mouth daily.     Vitamin D, Ergocalciferol, 50000 units CAPS Take one capsule by mouth every week 12 capsule 1   Current Facility-Administered Medications on File Prior to Visit  Medication Dose Route  Frequency Provider Last Rate Last Admin   gadopentetate dimeglumine (MAGNEVIST) injection 20 mL  20 mL Intravenous Once PRN Sater, Pearletha Furl, MD        Social History   Socioeconomic History   Marital status: Married    Spouse name: Not on file   Number of children: 3   Years of education: Not on file   Highest education level: Not on file  Occupational History   Not on file  Tobacco Use   Smoking status: Former    Current packs/day: 0.00    Types: Cigarettes    Quit date: 04/09/1986    Years since quitting: 36.7   Smokeless tobacco: Never  Vaping Use   Vaping status: Never Used  Substance and Sexual Activity   Alcohol use: Yes    Alcohol/week: 0.0 standard drinks of alcohol    Comment: RARELY- SOCIALLY ONLY   Drug use: No   Sexual activity: Yes    Birth control/protection: Surgical    Comment: TUBAL LIGATION-1st intercourse 57 yo-Fewer than 5 partners  Other Topics Concern   Not on file  Social History Narrative   Not on file   Social  Determinants of Health   Financial Resource Strain: Not on file  Food Insecurity: Not on file  Transportation Needs: Not on file  Physical Activity: Not on file  Stress: Not on file  Social Connections: Not on file  Intimate Partner Violence: Not on file    Family History  Problem Relation Age of Onset   Cirrhosis Mother        hep c   Heart disease Father    Breast cancer Sister        dx in her 35s   Colon cancer Brother 57   Breast cancer Maternal Aunt        dx in her 35s   Colon cancer Maternal Uncle        dx in his 47s   Kidney cancer Sister 69   Rheum arthritis Sister    Colon cancer Brother 29   Cancer Maternal Aunt        cervical vs. ovarian    No Known Allergies    PE Today's Vitals   01/08/23 1340  BP: 108/80  Pulse: 84  SpO2: 99%  Weight: 193 lb (87.5 kg)  Height: 5' 2.5" (1.588 m)   Body mass index is 34.74 kg/m.  Physical Exam Vitals reviewed. Exam conducted with a chaperone present.  Constitutional:      General: She is not in acute distress.    Appearance: Normal appearance.  HENT:     Head: Normocephalic and atraumatic.     Nose: Nose normal.  Eyes:     Extraocular Movements: Extraocular movements intact.     Conjunctiva/sclera: Conjunctivae normal.  Neck:     Thyroid: No thyroid mass, thyromegaly or thyroid tenderness.  Pulmonary:     Effort: Pulmonary effort is normal.  Chest:     Chest wall: No mass or tenderness.  Breasts:    Right: Normal. No swelling, mass, nipple discharge, skin change or tenderness.     Left: Normal. No swelling, mass, nipple discharge, skin change or tenderness.  Abdominal:     General: There is no distension.     Palpations: Abdomen is soft.     Tenderness: There is no abdominal tenderness.  Genitourinary:    General: Normal vulva.     Exam position: Lithotomy position.     Urethra: No prolapse.  Vagina: Normal. No vaginal discharge or bleeding.     Cervix: Normal. No lesion.     Uterus:  Normal. Not enlarged and not tender.      Adnexa: Right adnexa normal and left adnexa normal.     Comments: Vulva generally swollen, ?lichenification No erythema, tenderness Musculoskeletal:        General: Normal range of motion.     Cervical back: Normal range of motion.  Lymphadenopathy:     Upper Body:     Right upper body: No axillary adenopathy.     Left upper body: No axillary adenopathy.     Lower Body: No right inguinal adenopathy. No left inguinal adenopathy.  Skin:    General: Skin is warm and dry.  Neurological:     General: No focal deficit present.     Mental Status: She is alert.  Psychiatric:        Mood and Affect: Mood normal.        Behavior: Behavior normal.       Assessment and Plan:        Well woman exam with routine gynecological exam Assessment & Plan: Cervical cancer screening performed according to ASCCP guidelines. Encouraged annual mammogram screening Colonoscopy UTD DXA N/A Labs and immunizations with her primary Encouraged safe sexual practices as indicated Encouraged healthy lifestyle practices with diet and exercise For patients under 50-70yo, I recommend 1200mg  calcium daily and 600IU of vitamin D daily.    Vulvar irritation -     Urinalysis,Complete w/RFL Culture  Vaginal discharge -     WET PREP FOR TRICH, YEAST, CLUE  Lichen simplex chronicus Assessment & Plan: Vs LS RTO in 1 month for reassessment  Orders: -     Clobetasol Propionate; Apply 1 Application topically at bedtime.  Dispense: 60 g; Refill: 0  Low libido Assessment & Plan: Also discussed multifactorial etiologies for low libido. Could consider testosterone after menopause, however only has a modest effect.  Encouraged healthy lifestyle and intentionality around physical intimacy.  Reviewed that supplements are not FDA regulated. RTO for further discussion. Pamphlet for HRT provided.   Other orders -     Urine Culture -     REFLEXIVE URINE  CULTURE    Rosalyn Gess, MD

## 2023-01-08 NOTE — Patient Instructions (Signed)
For patients under 50-56yo, I recommend 1200mg  calcium daily and 600IU of vitamin D daily. For patients over 56yo, I recommend 1200mg  calcium daily and 800IU of vitamin D daily.  Health Maintenance, Female Adopting a healthy lifestyle and getting preventive care are important in promoting health and wellness. Ask your health care provider about: The right schedule for you to have regular tests and exams. Things you can do on your own to prevent diseases and keep yourself healthy. What should I know about diet, weight, and exercise? Eat a healthy diet  Eat a diet that includes plenty of vegetables, fruits, low-fat dairy products, and lean protein. Do not eat a lot of foods that are high in solid fats, added sugars, or sodium. Maintain a healthy weight Body mass index (BMI) is used to identify weight problems. It estimates body fat based on height and weight. Your health care provider can help determine your BMI and help you achieve or maintain a healthy weight. Get regular exercise Get regular exercise. This is one of the most important things you can do for your health. Most adults should: Exercise for at least 150 minutes each week. The exercise should increase your heart rate and make you sweat (moderate-intensity exercise). Do strengthening exercises at least twice a week. This is in addition to the moderate-intensity exercise. Spend less time sitting. Even light physical activity can be beneficial. Watch cholesterol and blood lipids Have your blood tested for lipids and cholesterol at 56 years of age, then have this test every 5 years. Have your cholesterol levels checked more often if: Your lipid or cholesterol levels are high. You are older than 56 years of age. You are at high risk for heart disease. What should I know about cancer screening? Depending on your health history and family history, you may need to have cancer screening at various ages. This may include screening  for: Breast cancer. Cervical cancer. Colorectal cancer. Skin cancer. Lung cancer. What should I know about heart disease, diabetes, and high blood pressure? Blood pressure and heart disease High blood pressure causes heart disease and increases the risk of stroke. This is more likely to develop in people who have high blood pressure readings or are overweight. Have your blood pressure checked: Every 3-5 years if you are 83-56 years of age. Every year if you are 4 years old or older. Diabetes Have regular diabetes screenings. This checks your fasting blood sugar level. Have the screening done: Once every three years after age 68 if you are at a normal weight and have a low risk for diabetes. More often and at a younger age if you are overweight or have a high risk for diabetes. What should I know about preventing infection? Hepatitis B If you have a higher risk for hepatitis B, you should be screened for this virus. Talk with your health care provider to find out if you are at risk for hepatitis B infection. Hepatitis C Testing is recommended for: Everyone born from [birth year] through 1964. Anyone with known risk factors for hepatitis C. Sexually transmitted infections (STIs) Get screened for STIs, including gonorrhea and chlamydia, if: You are sexually active and are younger than 56 years of age. You are older than 56 years of age and your health care provider tells you that you are at risk for this type of infection. Your sexual activity has changed since you were last screened, and you are at increased risk for chlamydia or gonorrhea. Ask your health care provider if  you are at risk. Ask your health care provider about whether you are at high risk for HIV. Your health care provider may recommend a prescription medicine to help prevent HIV infection. If you choose to take medicine to prevent HIV, you should first get tested for HIV. You should then be tested every 3 months for as long as you  are taking the medicine. Osteoporosis and menopause Osteoporosis is a disease in which the bones lose minerals and strength with aging. This can result in bone fractures. If you are 44 years old or older, or if you are at risk for osteoporosis and fractures, ask your health care provider if you should: Be screened for bone loss. Take a calcium or vitamin D supplement to lower your risk of fractures. Be given hormone replacement therapy (HRT) to treat symptoms of menopause. Follow these instructions at home: Alcohol use Do not drink alcohol if: Your health care provider tells you not to drink. You are pregnant, may be pregnant, or are planning to become pregnant. If you drink alcohol: Limit how much you have to: 0-1 drink a day. Know how much alcohol is in your drink. In the [place] one drink equals one 12 oz bottle of beer (355 mL), one 5 oz glass of wine (148 mL), or one 1 oz glass of hard liquor (44 mL). Lifestyle Do not use any products that contain nicotine or tobacco. These products include cigarettes, chewing tobacco, and vaping devices, such as e-cigarettes. If you need help quitting, ask your health care provider. Do not use street drugs. Do not share needles. Ask your health care provider for help if you need support or information about quitting drugs. General instructions Schedule regular health, dental, and eye exams. Stay current with your vaccines. Tell your health care provider if: You often feel depressed. You have ever been abused or do not feel safe at home. Summary Adopting a healthy lifestyle and getting preventive care are important in promoting health and wellness. Follow your health care provider's instructions about healthy diet, exercising, and getting tested or screened for diseases. Follow your health care provider's instructions on monitoring your cholesterol and blood pressure. This information is not intended to replace advice given to you by your health  care provider. Make sure you discuss any questions you have with your health care provider. Document Revised: 06/20/2020 Document Reviewed: 06/20/2020 Elsevier Patient Education  2024 [employer].

## 2023-01-08 NOTE — Assessment & Plan Note (Signed)
Vs LS RTO in 1 month for reassessment

## 2023-01-08 NOTE — Assessment & Plan Note (Signed)

## 2023-01-09 ENCOUNTER — Encounter (HOSPITAL_COMMUNITY): Payer: Self-pay

## 2023-01-09 ENCOUNTER — Other Ambulatory Visit (HOSPITAL_COMMUNITY): Payer: Self-pay

## 2023-01-10 LAB — URINALYSIS, COMPLETE W/RFL CULTURE
Bilirubin Urine: NEGATIVE
Casts: NONE SEEN /LPF
Crystals: NONE SEEN /HPF
Glucose, UA: NEGATIVE
Hgb urine dipstick: NEGATIVE
Leukocyte Esterase: NEGATIVE
Nitrites, Initial: NEGATIVE
Protein, ur: NEGATIVE
RBC / HPF: NONE SEEN /HPF (ref 0–2)
Specific Gravity, Urine: 1.025 (ref 1.001–1.035)
Yeast: NONE SEEN /HPF
pH: 5.5 (ref 5.0–8.0)

## 2023-01-10 LAB — URINE CULTURE
MICRO NUMBER:: 15781715
Result:: NO GROWTH
SPECIMEN QUALITY:: ADEQUATE

## 2023-01-10 LAB — CULTURE INDICATED

## 2023-01-11 ENCOUNTER — Other Ambulatory Visit (HOSPITAL_COMMUNITY): Payer: Self-pay

## 2023-01-14 ENCOUNTER — Other Ambulatory Visit (HOSPITAL_COMMUNITY): Payer: Self-pay

## 2023-01-17 ENCOUNTER — Encounter (HOSPITAL_COMMUNITY): Payer: Self-pay

## 2023-01-17 ENCOUNTER — Other Ambulatory Visit (HOSPITAL_COMMUNITY): Payer: Self-pay

## 2023-01-18 ENCOUNTER — Other Ambulatory Visit (HOSPITAL_COMMUNITY): Payer: Self-pay

## 2023-01-21 ENCOUNTER — Other Ambulatory Visit (HOSPITAL_COMMUNITY): Payer: Self-pay

## 2023-01-22 ENCOUNTER — Other Ambulatory Visit: Payer: Self-pay

## 2023-01-22 ENCOUNTER — Other Ambulatory Visit (HOSPITAL_COMMUNITY): Payer: Self-pay

## 2023-01-22 MED ORDER — SEMAGLUTIDE-WEIGHT MANAGEMENT 2.4 MG/0.75ML ~~LOC~~ SOAJ
2.4000 mg | SUBCUTANEOUS | 2 refills | Status: AC
  Filled 2023-01-22: qty 3, 28d supply, fill #0
  Filled 2023-02-14: qty 3, 28d supply, fill #1
  Filled 2023-02-26: qty 3, 28d supply, fill #0
  Filled 2023-11-08 – 2024-01-17 (×3): qty 3, 28d supply, fill #1

## 2023-02-14 ENCOUNTER — Other Ambulatory Visit (HOSPITAL_BASED_OUTPATIENT_CLINIC_OR_DEPARTMENT_OTHER): Payer: Self-pay

## 2023-02-19 ENCOUNTER — Ambulatory Visit: Payer: No Typology Code available for payment source | Admitting: Obstetrics and Gynecology

## 2023-02-20 ENCOUNTER — Encounter (HOSPITAL_COMMUNITY): Payer: Self-pay

## 2023-02-20 ENCOUNTER — Other Ambulatory Visit (HOSPITAL_COMMUNITY): Payer: Self-pay

## 2023-02-21 ENCOUNTER — Other Ambulatory Visit (HOSPITAL_COMMUNITY): Payer: Self-pay

## 2023-02-22 ENCOUNTER — Other Ambulatory Visit: Payer: Self-pay

## 2023-02-26 ENCOUNTER — Other Ambulatory Visit (HOSPITAL_BASED_OUTPATIENT_CLINIC_OR_DEPARTMENT_OTHER): Payer: Self-pay

## 2023-02-26 ENCOUNTER — Other Ambulatory Visit (HOSPITAL_COMMUNITY): Payer: Self-pay

## 2023-02-27 ENCOUNTER — Other Ambulatory Visit (HOSPITAL_BASED_OUTPATIENT_CLINIC_OR_DEPARTMENT_OTHER): Payer: Self-pay

## 2023-03-15 ENCOUNTER — Other Ambulatory Visit (HOSPITAL_COMMUNITY): Payer: Self-pay

## 2023-03-15 MED ORDER — WEGOVY 2.4 MG/0.75ML ~~LOC~~ SOAJ
2.4000 mg | SUBCUTANEOUS | 0 refills | Status: AC
  Filled 2023-03-21 (×2): qty 3, 28d supply, fill #0

## 2023-03-21 ENCOUNTER — Other Ambulatory Visit (HOSPITAL_COMMUNITY): Payer: Self-pay

## 2023-04-11 ENCOUNTER — Other Ambulatory Visit (HOSPITAL_COMMUNITY): Payer: Self-pay

## 2023-04-11 MED ORDER — WEGOVY 2.4 MG/0.75ML ~~LOC~~ SOAJ
2.4000 mg | SUBCUTANEOUS | 0 refills | Status: AC
  Filled 2023-04-11 – 2023-04-12 (×2): qty 3, 28d supply, fill #0

## 2023-04-12 ENCOUNTER — Other Ambulatory Visit (HOSPITAL_COMMUNITY): Payer: Self-pay

## 2023-04-17 ENCOUNTER — Other Ambulatory Visit (HOSPITAL_COMMUNITY): Payer: Self-pay

## 2023-05-13 ENCOUNTER — Other Ambulatory Visit (HOSPITAL_COMMUNITY): Payer: Self-pay

## 2023-05-13 MED ORDER — WEGOVY 2.4 MG/0.75ML ~~LOC~~ SOAJ
2.4000 mg | SUBCUTANEOUS | 0 refills | Status: AC
  Filled 2023-05-13 – 2023-05-27 (×2): qty 3, 28d supply, fill #0

## 2023-05-18 ENCOUNTER — Other Ambulatory Visit (HOSPITAL_COMMUNITY): Payer: Self-pay

## 2023-05-24 ENCOUNTER — Other Ambulatory Visit (HOSPITAL_COMMUNITY): Payer: Self-pay

## 2023-05-27 ENCOUNTER — Other Ambulatory Visit (HOSPITAL_COMMUNITY): Payer: Self-pay

## 2023-06-11 ENCOUNTER — Other Ambulatory Visit (HOSPITAL_COMMUNITY): Payer: Self-pay

## 2023-06-11 MED ORDER — WEGOVY 2.4 MG/0.75ML ~~LOC~~ SOAJ
2.4000 mg | SUBCUTANEOUS | 0 refills | Status: AC
  Filled 2023-06-11 – 2023-07-10 (×2): qty 3, 28d supply, fill #0

## 2023-07-10 ENCOUNTER — Other Ambulatory Visit: Payer: Self-pay

## 2023-07-10 ENCOUNTER — Other Ambulatory Visit (HOSPITAL_COMMUNITY): Payer: Self-pay

## 2023-07-11 ENCOUNTER — Other Ambulatory Visit (HOSPITAL_COMMUNITY): Payer: Self-pay

## 2023-07-29 ENCOUNTER — Other Ambulatory Visit (HOSPITAL_COMMUNITY): Payer: Self-pay

## 2023-07-29 MED ORDER — WEGOVY 2.4 MG/0.75ML ~~LOC~~ SOAJ
2.4000 mg | SUBCUTANEOUS | 0 refills | Status: AC
  Filled 2023-07-29 – 2023-08-01 (×2): qty 3, 28d supply, fill #0

## 2023-08-01 ENCOUNTER — Other Ambulatory Visit (HOSPITAL_COMMUNITY): Payer: Self-pay

## 2023-08-12 ENCOUNTER — Other Ambulatory Visit (HOSPITAL_COMMUNITY): Payer: Self-pay

## 2023-08-13 ENCOUNTER — Other Ambulatory Visit: Payer: Self-pay

## 2023-08-14 ENCOUNTER — Other Ambulatory Visit (HOSPITAL_COMMUNITY): Payer: Self-pay

## 2023-08-15 ENCOUNTER — Other Ambulatory Visit: Payer: Self-pay

## 2023-08-28 ENCOUNTER — Other Ambulatory Visit (HOSPITAL_COMMUNITY): Payer: Self-pay

## 2023-08-28 MED ORDER — WEGOVY 2.4 MG/0.75ML ~~LOC~~ SOAJ
2.4000 mg | SUBCUTANEOUS | 0 refills | Status: AC
  Filled 2023-08-28 – 2023-09-10 (×2): qty 3, 28d supply, fill #0

## 2023-09-06 ENCOUNTER — Other Ambulatory Visit (HOSPITAL_COMMUNITY): Payer: Self-pay

## 2023-09-09 ENCOUNTER — Other Ambulatory Visit (HOSPITAL_COMMUNITY): Payer: Self-pay

## 2023-09-10 ENCOUNTER — Other Ambulatory Visit (HOSPITAL_COMMUNITY): Payer: Self-pay

## 2023-09-11 ENCOUNTER — Other Ambulatory Visit (HOSPITAL_COMMUNITY): Payer: Self-pay

## 2023-10-07 ENCOUNTER — Other Ambulatory Visit (HOSPITAL_COMMUNITY): Payer: Self-pay

## 2023-10-07 MED ORDER — WEGOVY 2.4 MG/0.75ML ~~LOC~~ SOAJ
0.7500 mL | SUBCUTANEOUS | 0 refills | Status: AC
  Filled 2023-10-07 – 2023-10-17 (×2): qty 3, 28d supply, fill #0

## 2023-10-16 ENCOUNTER — Other Ambulatory Visit (HOSPITAL_COMMUNITY): Payer: Self-pay

## 2023-10-17 ENCOUNTER — Other Ambulatory Visit (HOSPITAL_COMMUNITY): Payer: Self-pay

## 2023-11-05 ENCOUNTER — Other Ambulatory Visit (HOSPITAL_COMMUNITY): Payer: Self-pay

## 2023-11-05 MED ORDER — WEGOVY 2.4 MG/0.75ML ~~LOC~~ SOAJ
2.4000 mg | SUBCUTANEOUS | 0 refills | Status: AC
  Filled 2023-11-05 – 2023-11-12 (×2): qty 3, 28d supply, fill #0

## 2023-11-08 ENCOUNTER — Other Ambulatory Visit (HOSPITAL_COMMUNITY): Payer: Self-pay

## 2023-11-08 ENCOUNTER — Encounter (HOSPITAL_COMMUNITY): Payer: Self-pay

## 2023-11-12 ENCOUNTER — Other Ambulatory Visit (HOSPITAL_COMMUNITY): Payer: Self-pay

## 2023-12-24 ENCOUNTER — Other Ambulatory Visit (HOSPITAL_COMMUNITY): Payer: Self-pay

## 2023-12-24 MED ORDER — WEGOVY 2.4 MG/0.75ML ~~LOC~~ SOAJ
2.4000 mg | SUBCUTANEOUS | 0 refills | Status: AC
  Filled 2023-12-24: qty 3, 28d supply, fill #0

## 2023-12-25 ENCOUNTER — Other Ambulatory Visit (HOSPITAL_COMMUNITY): Payer: Self-pay

## 2024-01-17 ENCOUNTER — Other Ambulatory Visit (HOSPITAL_COMMUNITY): Payer: Self-pay

## 2024-01-17 MED ORDER — WEGOVY 2.4 MG/0.75ML ~~LOC~~ SOAJ
2.4000 mg | SUBCUTANEOUS | 0 refills | Status: DC
  Filled 2024-01-17: qty 3, 28d supply, fill #0

## 2024-01-20 ENCOUNTER — Other Ambulatory Visit (HOSPITAL_COMMUNITY): Payer: Self-pay

## 2024-01-23 ENCOUNTER — Other Ambulatory Visit (HOSPITAL_COMMUNITY): Payer: Self-pay

## 2024-01-31 ENCOUNTER — Other Ambulatory Visit (HOSPITAL_COMMUNITY): Payer: Self-pay

## 2024-01-31 MED ORDER — VITAMIN D (ERGOCALCIFEROL) 50000 UNITS PO CAPS
1.0000 | ORAL_CAPSULE | ORAL | 1 refills | Status: AC
  Filled 2024-01-31: qty 12, 84d supply, fill #0

## 2024-02-14 ENCOUNTER — Other Ambulatory Visit: Payer: Self-pay

## 2024-02-14 ENCOUNTER — Other Ambulatory Visit (HOSPITAL_COMMUNITY): Payer: Self-pay

## 2024-02-14 MED ORDER — WEGOVY 2.4 MG/0.75ML ~~LOC~~ SOAJ
2.4000 mg | SUBCUTANEOUS | 0 refills | Status: DC
  Filled 2024-02-14 (×2): qty 3, 28d supply, fill #0

## 2024-02-15 ENCOUNTER — Other Ambulatory Visit (HOSPITAL_COMMUNITY): Payer: Self-pay

## 2024-02-17 ENCOUNTER — Other Ambulatory Visit: Payer: Self-pay

## 2024-03-13 ENCOUNTER — Other Ambulatory Visit (HOSPITAL_COMMUNITY): Payer: Self-pay

## 2024-03-13 MED ORDER — WEGOVY 2.4 MG/0.75ML ~~LOC~~ SOAJ
2.4000 mg | SUBCUTANEOUS | 0 refills | Status: AC
  Filled 2024-03-13 – 2024-03-16 (×2): qty 3, 28d supply, fill #0

## 2024-03-16 ENCOUNTER — Other Ambulatory Visit: Payer: Self-pay

## 2024-03-16 ENCOUNTER — Other Ambulatory Visit (HOSPITAL_COMMUNITY): Payer: Self-pay

## 2024-03-24 ENCOUNTER — Ambulatory Visit: Admitting: Obstetrics and Gynecology
